# Patient Record
Sex: Male | Born: 1975 | Race: Asian | Hispanic: No | Marital: Married | Smoking: Current some day smoker
Health system: Southern US, Community
[De-identification: ages and names within clinical notes are randomized; demographics above are authoritative.]

## PROBLEM LIST (undated history)

## (undated) DIAGNOSIS — I10 Essential (primary) hypertension: Secondary | ICD-10-CM

## (undated) DIAGNOSIS — K265 Chronic or unspecified duodenal ulcer with perforation: Secondary | ICD-10-CM

---

## 2009-03-21 ENCOUNTER — Emergency Department (HOSPITAL_COMMUNITY): Admission: EM | Admit: 2009-03-21 | Discharge: 2009-03-21 | Payer: Self-pay | Admitting: Family Medicine

## 2009-03-22 ENCOUNTER — Emergency Department (HOSPITAL_COMMUNITY): Admission: EM | Admit: 2009-03-22 | Discharge: 2009-03-22 | Payer: Self-pay | Admitting: Emergency Medicine

## 2009-04-04 ENCOUNTER — Emergency Department (HOSPITAL_COMMUNITY): Admission: EM | Admit: 2009-04-04 | Discharge: 2009-04-04 | Payer: Self-pay | Admitting: Family Medicine

## 2009-04-06 ENCOUNTER — Emergency Department (HOSPITAL_COMMUNITY): Admission: EM | Admit: 2009-04-06 | Discharge: 2009-04-06 | Payer: Self-pay | Admitting: Family Medicine

## 2010-01-26 ENCOUNTER — Emergency Department (HOSPITAL_COMMUNITY): Admission: EM | Admit: 2010-01-26 | Discharge: 2010-01-26 | Payer: Self-pay | Admitting: Family Medicine

## 2010-01-28 ENCOUNTER — Inpatient Hospital Stay (HOSPITAL_COMMUNITY): Admission: EM | Admit: 2010-01-28 | Discharge: 2010-01-31 | Payer: Self-pay | Admitting: Emergency Medicine

## 2010-01-28 ENCOUNTER — Emergency Department (HOSPITAL_COMMUNITY): Admission: EM | Admit: 2010-01-28 | Discharge: 2010-01-28 | Payer: Self-pay | Admitting: Emergency Medicine

## 2010-01-29 ENCOUNTER — Ambulatory Visit: Payer: Self-pay | Admitting: Internal Medicine

## 2010-03-13 ENCOUNTER — Ambulatory Visit: Payer: Self-pay | Admitting: Internal Medicine

## 2010-03-20 ENCOUNTER — Encounter: Payer: Self-pay | Admitting: *Deleted

## 2010-12-15 NOTE — Miscellaneous (Signed)
Summary: Do Not Reschedule  Missed NP appt.  Per [facility] policy is not allowed to reschedule.  Dennison Nancy RN  Mar 20, 2010 5:32 PM

## 2011-02-08 LAB — COMPREHENSIVE METABOLIC PANEL
ALT: 54 U/L — ABNORMAL HIGH (ref 0–53)
Albumin: 3.5 g/dL (ref 3.5–5.2)
Alkaline Phosphatase: 73 U/L (ref 39–117)
BUN: 7 mg/dL (ref 6–23)
Calcium: 9.2 mg/dL (ref 8.4–10.5)
GFR calc non Af Amer: >60 mL/min (ref >60)
Glucose, Bld: 93 mg/dL (ref 70–99)
Sodium: 139 mEq/L (ref 135–145)
Total Bilirubin: 0.9 mg/dL (ref 0.3–1.2)
Total Protein: 6.9 g/dL (ref 6.0–8.3)

## 2011-02-08 LAB — CULTURE, ROUTINE-ABSCESS: Gram Stain: NONE SEEN

## 2011-02-08 LAB — CBC
HCT: 37.5 % — ABNORMAL LOW (ref 39.0–52.0)
Hemoglobin: 13 g/dL (ref 13.0–17.0)
Hemoglobin: 13.8 g/dL (ref 13.0–17.0)
MCHC: 34.2 g/dL (ref 30.0–36.0)
MCHC: 34.6 g/dL (ref 30.0–36.0)
MCHC: 34.8 g/dL (ref 30.0–36.0)
MCV: 92.6 fL (ref 78.0–100.0)
Platelets: 198 10*3/uL (ref 150–400)
Platelets: 199 10*3/uL (ref 150–400)
RBC: 4.59 MIL/uL (ref 4.22–5.81)
RDW: 12.3 % (ref 11.5–15.5)
RDW: 12.4 % (ref 11.5–15.5)
WBC: 13.7 10*3/uL — ABNORMAL HIGH (ref 4.0–10.5)
WBC: 6.7 10*3/uL (ref 4.0–10.5)
WBC: 9.6 10*3/uL (ref 4.0–10.5)

## 2011-02-08 LAB — CULTURE, BLOOD (ROUTINE X 2): Culture: NO GROWTH

## 2011-02-08 LAB — DIFFERENTIAL
Basophils Absolute: 0 10*3/uL (ref 0.0–0.1)
Eosinophils Relative: 2 % (ref 0–5)
Lymphocytes Relative: 11 % — ABNORMAL LOW (ref 12–46)
Lymphs Abs: 1.5 10*3/uL (ref 0.7–4.0)
Lymphs Abs: 1.5 10*3/uL (ref 0.7–4.0)
Monocytes Absolute: 0.7 10*3/uL (ref 0.1–1.0)
Monocytes Absolute: 0.8 10*3/uL (ref 0.1–1.0)
Monocytes Relative: 5 % (ref 3–12)
Monocytes Relative: 8 % (ref 3–12)
Neutro Abs: 7.1 10*3/uL (ref 1.7–7.7)
Neutrophils Relative %: 74 % (ref 43–77)
Neutrophils Relative %: 84 % — ABNORMAL HIGH (ref 43–77)

## 2011-02-08 LAB — HEPATITIS PANEL, ACUTE
HCV Ab: NEGATIVE
Hep A IgM: NEGATIVE
Hep B C IgM: NEGATIVE
Hepatitis B Surface Ag: NEGATIVE

## 2011-02-08 LAB — HEMOGLOBIN A1C: Mean Plasma Glucose: 108 mg/dL

## 2011-02-08 LAB — BASIC METABOLIC PANEL
GFR calc Af Amer: >60 mL/min (ref >60)
GFR calc Af Amer: >60 mL/min (ref >60)
GFR calc non Af Amer: >60 mL/min (ref >60)
Sodium: 137 mEq/L (ref 135–145)

## 2011-02-08 LAB — C4 COMPLEMENT: Complement C4, Body Fluid: 21 mg/dL (ref 16–47)

## 2011-02-23 LAB — CULTURE, ROUTINE-ABSCESS

## 2011-02-28 ENCOUNTER — Emergency Department (HOSPITAL_COMMUNITY)
Admission: EM | Admit: 2011-02-28 | Discharge: 2011-02-28 | Disposition: A | Discharge status: Home / Self Care | Payer: No Typology Code available for payment source | Attending: Emergency Medicine | Admitting: Emergency Medicine

## 2011-02-28 ENCOUNTER — Inpatient Hospital Stay (INDEPENDENT_AMBULATORY_CARE_PROVIDER_SITE_OTHER)
Admission: RE | Admit: 2011-02-28 | Discharge: 2011-02-28 | Disposition: A | Discharge status: Home / Self Care | Payer: No Typology Code available for payment source | Source: Ambulatory Visit | Attending: Family Medicine | Admitting: Family Medicine

## 2011-02-28 DIAGNOSIS — R109 Unspecified abdominal pain: Secondary | ICD-10-CM | POA: Insufficient documentation

## 2011-02-28 DIAGNOSIS — R112 Nausea with vomiting, unspecified: Secondary | ICD-10-CM

## 2011-02-28 DIAGNOSIS — R1011 Right upper quadrant pain: Secondary | ICD-10-CM

## 2011-02-28 DIAGNOSIS — I10 Essential (primary) hypertension: Secondary | ICD-10-CM

## 2011-02-28 DIAGNOSIS — R630 Anorexia: Secondary | ICD-10-CM | POA: Insufficient documentation

## 2011-02-28 LAB — POCT URINALYSIS DIP (DEVICE)
Bilirubin Urine: NEGATIVE
Glucose, UA: NEGATIVE mg/dL
Ketones, ur: NEGATIVE mg/dL
pH: 7.5 (ref 5.0–8.0)

## 2011-02-28 LAB — CBC
Hemoglobin: 16.1 g/dL (ref 13.0–17.0)
MCH: 32.2 pg (ref 26.0–34.0)
MCV: 88.2 fL (ref 78.0–100.0)
RBC: 5 MIL/uL (ref 4.22–5.81)
WBC: 12.6 10*3/uL — ABNORMAL HIGH (ref 4.0–10.5)

## 2011-02-28 LAB — DIFFERENTIAL
Lymphocytes Relative: 10 % — ABNORMAL LOW (ref 12–46)
Lymphs Abs: 1.2 10*3/uL (ref 0.7–4.0)
Monocytes Relative: 7 % (ref 3–12)
Neutro Abs: 10.4 10*3/uL — ABNORMAL HIGH (ref 1.7–7.7)
Neutrophils Relative %: 82 % — ABNORMAL HIGH (ref 43–77)

## 2011-02-28 LAB — COMPREHENSIVE METABOLIC PANEL
AST: 27 U/L (ref 0–37)
Alkaline Phosphatase: 60 U/L (ref 39–117)
BUN: 10 mg/dL (ref 6–23)
CO2: 31 mEq/L (ref 19–32)
Chloride: 99 mEq/L (ref 96–112)
Creatinine, Ser: 0.83 mg/dL (ref 0.4–1.5)
GFR calc non Af Amer: >60 mL/min (ref >60)
Potassium: 3.4 mEq/L — ABNORMAL LOW (ref 3.5–5.1)
Total Bilirubin: 0.8 mg/dL (ref 0.3–1.2)

## 2011-02-28 LAB — POCT I-STAT, CHEM 8
BUN: 13 mg/dL (ref 6–23)
Chloride: 98 mEq/L (ref 96–112)
Potassium: 3.4 mEq/L — ABNORMAL LOW (ref 3.5–5.1)
Sodium: 136 mEq/L (ref 135–145)

## 2011-03-01 ENCOUNTER — Emergency Department (HOSPITAL_COMMUNITY)
Admission: EM | Admit: 2011-03-01 | Discharge: 2011-03-02 | Disposition: A | Discharge status: Home / Self Care | Payer: No Typology Code available for payment source | Attending: Emergency Medicine | Admitting: Emergency Medicine

## 2011-03-01 DIAGNOSIS — R109 Unspecified abdominal pain: Secondary | ICD-10-CM | POA: Insufficient documentation

## 2011-03-01 LAB — CBC
HCT: 41.9 % (ref 39.0–52.0)
Hemoglobin: 15.4 g/dL (ref 13.0–17.0)
MCV: 86.9 fL (ref 78.0–100.0)
RBC: 4.82 MIL/uL (ref 4.22–5.81)
RDW: 12.2 % (ref 11.5–15.5)
WBC: 9.2 10*3/uL (ref 4.0–10.5)

## 2011-03-01 LAB — COMPREHENSIVE METABOLIC PANEL
ALT: 18 U/L (ref 0–53)
Alkaline Phosphatase: 53 U/L (ref 39–117)
BUN: 6 mg/dL (ref 6–23)
CO2: 26 mEq/L (ref 19–32)
Chloride: 101 mEq/L (ref 96–112)
Glucose, Bld: 121 mg/dL — ABNORMAL HIGH (ref 70–99)
Potassium: 3.5 mEq/L (ref 3.5–5.1)
Sodium: 133 mEq/L — ABNORMAL LOW (ref 135–145)
Total Bilirubin: 0.9 mg/dL (ref 0.3–1.2)
Total Protein: 7.3 g/dL (ref 6.0–8.3)

## 2011-03-01 LAB — URINE MICROSCOPIC-ADD ON

## 2011-03-01 LAB — URINALYSIS, ROUTINE W REFLEX MICROSCOPIC
Glucose, UA: NEGATIVE mg/dL
Hgb urine dipstick: NEGATIVE
Ketones, ur: 15 mg/dL — AB
Protein, ur: NEGATIVE mg/dL
Urobilinogen, UA: 1 mg/dL (ref 0.0–1.0)

## 2011-03-01 LAB — DIFFERENTIAL
Basophils Absolute: 0 10*3/uL (ref 0.0–0.1)
Eosinophils Relative: 0 % (ref 0–5)
Lymphocytes Relative: 16 % (ref 12–46)
Lymphs Abs: 1.5 10*3/uL (ref 0.7–4.0)
Neutro Abs: 6.8 10*3/uL (ref 1.7–7.7)
Neutrophils Relative %: 74 % (ref 43–77)

## 2011-03-01 LAB — LIPASE, BLOOD: Lipase: 19 U/L (ref 11–59)

## 2011-03-02 ENCOUNTER — Emergency Department (HOSPITAL_COMMUNITY): Payer: No Typology Code available for payment source

## 2014-08-15 ENCOUNTER — Encounter (HOSPITAL_COMMUNITY): Payer: Self-pay | Admitting: Emergency Medicine

## 2014-08-15 ENCOUNTER — Emergency Department (HOSPITAL_COMMUNITY): Payer: Medicaid Other

## 2014-08-15 ENCOUNTER — Encounter (HOSPITAL_COMMUNITY): Admission: EM | Disposition: A | Discharge status: Home / Self Care | Payer: Self-pay | Source: Home / Self Care

## 2014-08-15 ENCOUNTER — Inpatient Hospital Stay (HOSPITAL_COMMUNITY)
Admission: EM | Admit: 2014-08-15 | Discharge: 2014-08-20 | DRG: 327 | Disposition: A | Discharge status: Home / Self Care | Payer: Medicaid Other | Attending: General Surgery | Admitting: General Surgery

## 2014-08-15 ENCOUNTER — Encounter (HOSPITAL_COMMUNITY): Payer: Medicaid Other | Admitting: Anesthesiology

## 2014-08-15 ENCOUNTER — Emergency Department (HOSPITAL_COMMUNITY): Payer: Medicaid Other | Admitting: Anesthesiology

## 2014-08-15 DIAGNOSIS — K913 Postprocedural intestinal obstruction: Secondary | ICD-10-CM | POA: Diagnosis present

## 2014-08-15 DIAGNOSIS — Y839 Surgical procedure, unspecified as the cause of abnormal reaction of the patient, or of later complication, without mention of misadventure at the time of the procedure: Secondary | ICD-10-CM | POA: Diagnosis present

## 2014-08-15 DIAGNOSIS — R1013 Epigastric pain: Secondary | ICD-10-CM | POA: Diagnosis present

## 2014-08-15 DIAGNOSIS — R198 Other specified symptoms and signs involving the digestive system and abdomen: Secondary | ICD-10-CM | POA: Diagnosis present

## 2014-08-15 DIAGNOSIS — F17210 Nicotine dependence, cigarettes, uncomplicated: Secondary | ICD-10-CM | POA: Diagnosis present

## 2014-08-15 DIAGNOSIS — K265 Chronic or unspecified duodenal ulcer with perforation: Principal | ICD-10-CM

## 2014-08-15 HISTORY — DX: Chronic or unspecified duodenal ulcer with perforation: K26.5

## 2014-08-15 HISTORY — PX: REPAIR OF PERFORATED ULCER: SHX6065

## 2014-08-15 HISTORY — PX: LAPAROTOMY: SHX154

## 2014-08-15 LAB — URINALYSIS, ROUTINE W REFLEX MICROSCOPIC
BILIRUBIN URINE: NEGATIVE
GLUCOSE, UA: NEGATIVE mg/dL
HGB URINE DIPSTICK: NEGATIVE
KETONES UR: NEGATIVE mg/dL
Leukocytes, UA: NEGATIVE
Nitrite: NEGATIVE
PROTEIN: NEGATIVE mg/dL
Specific Gravity, Urine: 1.014 (ref 1.005–1.030)
UROBILINOGEN UA: 0.2 mg/dL (ref 0.0–1.0)
pH: 7 (ref 5.0–8.0)

## 2014-08-15 LAB — COMPREHENSIVE METABOLIC PANEL
ALK PHOS: 46 U/L (ref 39–117)
ALT: 18 U/L (ref 0–53)
ANION GAP: 13 (ref 5–15)
AST: 22 U/L (ref 0–37)
Albumin: 3.8 g/dL (ref 3.5–5.2)
BILIRUBIN TOTAL: 0.3 mg/dL (ref 0.3–1.2)
BUN: 15 mg/dL (ref 6–23)
CO2: 26 meq/L (ref 19–32)
Calcium: 8.9 mg/dL (ref 8.4–10.5)
Chloride: 99 mEq/L (ref 96–112)
Creatinine, Ser: 0.61 mg/dL (ref 0.50–1.35)
GLUCOSE: 119 mg/dL — AB (ref 70–99)
POTASSIUM: 3.7 meq/L (ref 3.7–5.3)
Sodium: 138 mEq/L (ref 137–147)
TOTAL PROTEIN: 7.3 g/dL (ref 6.0–8.3)

## 2014-08-15 LAB — CBC WITH DIFFERENTIAL/PLATELET
Basophils Absolute: 0 10*3/uL (ref 0.0–0.1)
Basophils Relative: 0 % (ref 0–1)
EOS ABS: 0 10*3/uL (ref 0.0–0.7)
Eosinophils Relative: 1 % (ref 0–5)
HCT: 42.7 % (ref 39.0–52.0)
HEMOGLOBIN: 14.9 g/dL (ref 13.0–17.0)
LYMPHS ABS: 1.6 10*3/uL (ref 0.7–4.0)
LYMPHS PCT: 22 % (ref 12–46)
MCH: 31.8 pg (ref 26.0–34.0)
MCHC: 34.9 g/dL (ref 30.0–36.0)
MCV: 91.2 fL (ref 78.0–100.0)
MONOS PCT: 5 % (ref 3–12)
Monocytes Absolute: 0.4 10*3/uL (ref 0.1–1.0)
NEUTROS PCT: 72 % (ref 43–77)
Neutro Abs: 5.5 10*3/uL (ref 1.7–7.7)
Platelets: 156 10*3/uL (ref 150–400)
RBC: 4.68 MIL/uL (ref 4.22–5.81)
RDW: 12.1 % (ref 11.5–15.5)
WBC: 7.6 10*3/uL (ref 4.0–10.5)

## 2014-08-15 LAB — APTT: aPTT: 23 seconds — ABNORMAL LOW (ref 24–37)

## 2014-08-15 LAB — TYPE AND SCREEN
ABO/RH(D): A POS
ANTIBODY SCREEN: NEGATIVE

## 2014-08-15 LAB — ABO/RH: ABO/RH(D): A POS

## 2014-08-15 LAB — PROTIME-INR
INR: 0.99 (ref 0.00–1.49)
Prothrombin Time: 13.1 seconds (ref 11.6–15.2)

## 2014-08-15 LAB — LACTIC ACID, PLASMA: LACTIC ACID, VENOUS: 1 mmol/L (ref 0.5–2.2)

## 2014-08-15 LAB — LIPASE, BLOOD: Lipase: 31 U/L (ref 11–59)

## 2014-08-15 SURGERY — LAPAROTOMY, EXPLORATORY
Anesthesia: General | Site: Abdomen

## 2014-08-15 MED ORDER — ONDANSETRON HCL 4 MG/2ML IJ SOLN
INTRAMUSCULAR | Status: DC | PRN
  Administered 2014-08-15: 4 mg via INTRAVENOUS

## 2014-08-15 MED ORDER — ROCURONIUM BROMIDE 100 MG/10ML IV SOLN
INTRAVENOUS | Status: DC | PRN
  Administered 2014-08-15: 30 mg via INTRAVENOUS

## 2014-08-15 MED ORDER — ONDANSETRON HCL 4 MG/2ML IJ SOLN
4.0000 mg | Freq: Once | INTRAMUSCULAR | Status: AC
  Administered 2014-08-15: 4 mg via INTRAVENOUS
  Filled 2014-08-15: qty 2

## 2014-08-15 MED ORDER — MIDAZOLAM HCL 2 MG/2ML IJ SOLN
INTRAMUSCULAR | Status: AC
  Filled 2014-08-15: qty 2

## 2014-08-15 MED ORDER — IOHEXOL 300 MG/ML  SOLN
80.0000 mL | Freq: Once | INTRAMUSCULAR | Status: AC | PRN
  Administered 2014-08-15: 80 mL via INTRAVENOUS

## 2014-08-15 MED ORDER — NALOXONE HCL 0.4 MG/ML IJ SOLN: 0.4000 mg | INTRAMUSCULAR | Status: DC | PRN

## 2014-08-15 MED ORDER — OXYCODONE HCL 5 MG/5ML PO SOLN: 5.0000 mg | Freq: Once | ORAL | Status: DC | PRN

## 2014-08-15 MED ORDER — PROPOFOL 10 MG/ML IV BOLUS
INTRAVENOUS | Status: AC
  Filled 2014-08-15: qty 20

## 2014-08-15 MED ORDER — PROPOFOL 10 MG/ML IV BOLUS
INTRAVENOUS | Status: DC | PRN
  Administered 2014-08-15: 180 mg via INTRAVENOUS

## 2014-08-15 MED ORDER — LACTATED RINGERS IV SOLN
INTRAVENOUS | Status: DC | PRN
  Administered 2014-08-15 (×2): via INTRAVENOUS

## 2014-08-15 MED ORDER — DIPHENHYDRAMINE HCL 50 MG/ML IJ SOLN: 12.5000 mg | Freq: Four times a day (QID) | INTRAMUSCULAR | Status: DC | PRN

## 2014-08-15 MED ORDER — MORPHINE SULFATE (PF) 1 MG/ML IV SOLN
INTRAVENOUS | Status: AC
  Filled 2014-08-15: qty 25

## 2014-08-15 MED ORDER — ACETAMINOPHEN 650 MG RE SUPP: 650.0000 mg | RECTAL | Status: DC | PRN

## 2014-08-15 MED ORDER — MORPHINE SULFATE (PF) 1 MG/ML IV SOLN
INTRAVENOUS | Status: DC
  Administered 2014-08-15 (×2): 1.5 mg via INTRAVENOUS
  Administered 2014-08-16: 4.5 mg via INTRAVENOUS
  Administered 2014-08-16: 6 mg via INTRAVENOUS
  Administered 2014-08-16: 3 mg via INTRAVENOUS
  Administered 2014-08-16: 10.5 mg via INTRAVENOUS
  Administered 2014-08-16: 6 mg via INTRAVENOUS
  Administered 2014-08-16: 14:00:00 via INTRAVENOUS
  Administered 2014-08-16: 1.5 mg via INTRAVENOUS
  Administered 2014-08-17: 4.5 mg via INTRAVENOUS
  Administered 2014-08-17: 3 mg via INTRAVENOUS
  Administered 2014-08-17: 1.5 mg via INTRAVENOUS
  Administered 2014-08-17: 12:00:00 via INTRAVENOUS
  Administered 2014-08-17: 1.5 mg via INTRAVENOUS
  Administered 2014-08-17: 0.3 mg via INTRAVENOUS
  Administered 2014-08-17: 4.5 mg via INTRAVENOUS
  Administered 2014-08-18: 1.5 mg via INTRAVENOUS
  Administered 2014-08-18 (×2): 1 mL via INTRAVENOUS
  Administered 2014-08-18: 6 mg via INTRAVENOUS
  Administered 2014-08-19: 1.5 mg via INTRAVENOUS
  Filled 2014-08-15 (×2): qty 25

## 2014-08-15 MED ORDER — HYDROMORPHONE HCL 1 MG/ML IJ SOLN
1.0000 mg | Freq: Once | INTRAMUSCULAR | Status: AC
  Administered 2014-08-15: 1 mg via INTRAVENOUS
  Filled 2014-08-15: qty 1

## 2014-08-15 MED ORDER — KCL IN DEXTROSE-NACL 20-5-0.45 MEQ/L-%-% IV SOLN
INTRAVENOUS | Status: DC
  Administered 2014-08-16 – 2014-08-19 (×8): via INTRAVENOUS
  Filled 2014-08-15 (×13): qty 1000

## 2014-08-15 MED ORDER — 0.9 % SODIUM CHLORIDE (POUR BTL) OPTIME
TOPICAL | Status: DC | PRN
  Administered 2014-08-15 (×2): 1000 mL

## 2014-08-15 MED ORDER — LIDOCAINE HCL (CARDIAC) 20 MG/ML IV SOLN
INTRAVENOUS | Status: DC | PRN
  Administered 2014-08-15: 60 mg via INTRAVENOUS

## 2014-08-15 MED ORDER — DIPHENHYDRAMINE HCL 12.5 MG/5ML PO ELIX: 12.5000 mg | ORAL_SOLUTION | Freq: Four times a day (QID) | ORAL | Status: DC | PRN

## 2014-08-15 MED ORDER — PANTOPRAZOLE SODIUM 40 MG IV SOLR
40.0000 mg | Freq: Two times a day (BID) | INTRAVENOUS | Status: DC
  Administered 2014-08-15 – 2014-08-20 (×10): 40 mg via INTRAVENOUS
  Filled 2014-08-15 (×13): qty 40

## 2014-08-15 MED ORDER — FLUCONAZOLE IN SODIUM CHLORIDE 200-0.9 MG/100ML-% IV SOLN
200.0000 mg | INTRAVENOUS | Status: DC
  Administered 2014-08-16 – 2014-08-19 (×4): 200 mg via INTRAVENOUS
  Filled 2014-08-15 (×6): qty 100

## 2014-08-15 MED ORDER — HYDROMORPHONE HCL 1 MG/ML IJ SOLN
2.0000 mg | Freq: Once | INTRAMUSCULAR | Status: AC
  Administered 2014-08-15: 2 mg via INTRAVENOUS
  Filled 2014-08-15: qty 2

## 2014-08-15 MED ORDER — OXYCODONE HCL 5 MG PO TABS: 5.0000 mg | ORAL_TABLET | Freq: Once | ORAL | Status: DC | PRN

## 2014-08-15 MED ORDER — FLUCONAZOLE IN SODIUM CHLORIDE 200-0.9 MG/100ML-% IV SOLN
200.0000 mg | Freq: Once | INTRAVENOUS | Status: AC
  Administered 2014-08-15: 200 mg via INTRAVENOUS
  Filled 2014-08-15 (×2): qty 100

## 2014-08-15 MED ORDER — LABETALOL HCL 5 MG/ML IV SOLN
INTRAVENOUS | Status: DC | PRN
  Administered 2014-08-15 (×2): 5 mg via INTRAVENOUS

## 2014-08-15 MED ORDER — HYDROMORPHONE HCL 1 MG/ML IJ SOLN: 0.2500 mg | INTRAMUSCULAR | Status: DC | PRN

## 2014-08-15 MED ORDER — NEOSTIGMINE METHYLSULFATE 10 MG/10ML IV SOLN
INTRAVENOUS | Status: AC
  Filled 2014-08-15: qty 1

## 2014-08-15 MED ORDER — SODIUM CHLORIDE 0.9 % IJ SOLN: 9.0000 mL | INTRAMUSCULAR | Status: DC | PRN

## 2014-08-15 MED ORDER — GLYCOPYRROLATE 0.2 MG/ML IJ SOLN
INTRAMUSCULAR | Status: AC
  Filled 2014-08-15: qty 2

## 2014-08-15 MED ORDER — GLYCOPYRROLATE 0.2 MG/ML IJ SOLN
INTRAMUSCULAR | Status: DC | PRN
  Administered 2014-08-15: 0.4 mg via INTRAVENOUS

## 2014-08-15 MED ORDER — PIPERACILLIN-TAZOBACTAM 3.375 G IVPB
3.3750 g | Freq: Three times a day (TID) | INTRAVENOUS | Status: DC
  Administered 2014-08-15 – 2014-08-20 (×14): 3.375 g via INTRAVENOUS
  Filled 2014-08-15 (×19): qty 50

## 2014-08-15 MED ORDER — KETOROLAC TROMETHAMINE 30 MG/ML IJ SOLN: 15.0000 mg | Freq: Once | INTRAMUSCULAR | Status: DC | PRN

## 2014-08-15 MED ORDER — PIPERACILLIN-TAZOBACTAM 3.375 G IVPB
3.3750 g | Freq: Three times a day (TID) | INTRAVENOUS | Status: DC
  Administered 2014-08-15: 3.375 g via INTRAVENOUS
  Filled 2014-08-15 (×3): qty 50

## 2014-08-15 MED ORDER — SODIUM CHLORIDE 0.9 % IV BOLUS (SEPSIS)
1000.0000 mL | Freq: Once | INTRAVENOUS | Status: AC
  Administered 2014-08-15: 1000 mL via INTRAVENOUS

## 2014-08-15 MED ORDER — ACETAMINOPHEN 160 MG/5ML PO SOLN: 325.0000 mg | ORAL | Status: DC | PRN

## 2014-08-15 MED ORDER — FENTANYL CITRATE 0.05 MG/ML IJ SOLN
INTRAMUSCULAR | Status: AC
  Filled 2014-08-15: qty 5

## 2014-08-15 MED ORDER — NEOSTIGMINE METHYLSULFATE 10 MG/10ML IV SOLN
INTRAVENOUS | Status: DC | PRN
  Administered 2014-08-15: 3 mg via INTRAVENOUS

## 2014-08-15 MED ORDER — SUCCINYLCHOLINE CHLORIDE 20 MG/ML IJ SOLN
INTRAMUSCULAR | Status: DC | PRN
  Administered 2014-08-15: 40 mg via INTRAVENOUS

## 2014-08-15 MED ORDER — ACETAMINOPHEN 325 MG PO TABS: 325.0000 mg | ORAL_TABLET | ORAL | Status: DC | PRN

## 2014-08-15 MED ORDER — FENTANYL CITRATE 0.05 MG/ML IJ SOLN
INTRAMUSCULAR | Status: DC | PRN
  Administered 2014-08-15: 50 ug via INTRAVENOUS
  Administered 2014-08-15: 100 ug via INTRAVENOUS
  Administered 2014-08-15 (×2): 50 ug via INTRAVENOUS

## 2014-08-15 MED ORDER — ONDANSETRON HCL 4 MG/2ML IJ SOLN: 4.0000 mg | Freq: Four times a day (QID) | INTRAMUSCULAR | Status: DC | PRN

## 2014-08-15 MED ORDER — LACTATED RINGERS IV SOLN
INTRAVENOUS | Status: DC
  Administered 2014-08-15: 15:00:00 via INTRAVENOUS

## 2014-08-15 MED ORDER — IOHEXOL 300 MG/ML  SOLN
25.0000 mL | Freq: Once | INTRAMUSCULAR | Status: AC | PRN
  Administered 2014-08-15: 25 mL via ORAL

## 2014-08-15 MED ORDER — ENOXAPARIN SODIUM 40 MG/0.4ML ~~LOC~~ SOLN
40.0000 mg | SUBCUTANEOUS | Status: DC
  Administered 2014-08-16 – 2014-08-19 (×4): 40 mg via SUBCUTANEOUS
  Filled 2014-08-15 (×6): qty 0.4

## 2014-08-15 MED ORDER — MIDAZOLAM HCL 5 MG/5ML IJ SOLN
INTRAMUSCULAR | Status: DC | PRN
  Administered 2014-08-15: 2 mg via INTRAVENOUS

## 2014-08-15 SURGICAL SUPPLY — 50 items
BLADE SURG ROTATE 9660 (MISCELLANEOUS) IMPLANT
CANISTER SUCTION 2500CC (MISCELLANEOUS) ×3 IMPLANT
CHLORAPREP W/TINT 26ML (MISCELLANEOUS) ×3 IMPLANT
COVER MAYO STAND STRL (DRAPES) IMPLANT
COVER SURGICAL LIGHT HANDLE (MISCELLANEOUS) ×3 IMPLANT
DRAPE LAPAROSCOPIC ABDOMINAL (DRAPES) ×3 IMPLANT
DRAPE PROXIMA HALF (DRAPES) IMPLANT
DRAPE UTILITY 15X26 W/TAPE STR (DRAPE) ×6 IMPLANT
DRAPE WARM FLUID 44X44 (DRAPE) ×3 IMPLANT
DRSG OPSITE POSTOP 4X10 (GAUZE/BANDAGES/DRESSINGS) IMPLANT
DRSG OPSITE POSTOP 4X8 (GAUZE/BANDAGES/DRESSINGS) IMPLANT
ELECT BLADE 6.5 EXT (BLADE) IMPLANT
ELECT CAUTERY BLADE 6.4 (BLADE) ×3 IMPLANT
ELECT REM PT RETURN 9FT ADLT (ELECTROSURGICAL) ×3
ELECTRODE REM PT RTRN 9FT ADLT (ELECTROSURGICAL) ×1 IMPLANT
GLOVE BIO SURGEON STRL SZ8 (GLOVE) ×3 IMPLANT
GLOVE BIOGEL PI IND STRL 7.0 (GLOVE) ×1 IMPLANT
GLOVE BIOGEL PI IND STRL 8 (GLOVE) ×1 IMPLANT
GLOVE BIOGEL PI INDICATOR 7.0 (GLOVE) ×2
GLOVE BIOGEL PI INDICATOR 8 (GLOVE) ×2
GLOVE SURG SS PI 7.0 STRL IVOR (GLOVE) ×3 IMPLANT
GOWN STRL REUS W/ TWL LRG LVL3 (GOWN DISPOSABLE) ×2 IMPLANT
GOWN STRL REUS W/ TWL XL LVL3 (GOWN DISPOSABLE) ×1 IMPLANT
GOWN STRL REUS W/TWL LRG LVL3 (GOWN DISPOSABLE) ×4
GOWN STRL REUS W/TWL XL LVL3 (GOWN DISPOSABLE) ×2
KIT BASIN OR (CUSTOM PROCEDURE TRAY) ×3 IMPLANT
KIT ROOM TURNOVER OR (KITS) ×3 IMPLANT
LIGASURE IMPACT 36 18CM CVD LR (INSTRUMENTS) IMPLANT
NS IRRIG 1000ML POUR BTL (IV SOLUTION) ×6 IMPLANT
PACK GENERAL/GYN (CUSTOM PROCEDURE TRAY) ×3 IMPLANT
PAD ARMBOARD 7.5X6 YLW CONV (MISCELLANEOUS) ×3 IMPLANT
PENCIL BUTTON HOLSTER BLD 10FT (ELECTRODE) IMPLANT
SPECIMEN JAR LARGE (MISCELLANEOUS) IMPLANT
SPONGE GAUZE 4X4 12PLY STER LF (GAUZE/BANDAGES/DRESSINGS) ×3 IMPLANT
SPONGE LAP 18X18 X RAY DECT (DISPOSABLE) IMPLANT
STAPLER VISISTAT 35W (STAPLE) ×3 IMPLANT
SUCTION POOLE TIP (SUCTIONS) ×3 IMPLANT
SUT PDS AB 1 TP1 96 (SUTURE) ×6 IMPLANT
SUT SILK 2 0 SH CR/8 (SUTURE) ×3 IMPLANT
SUT SILK 2 0 TIES 10X30 (SUTURE) ×3 IMPLANT
SUT SILK 3 0 SH CR/8 (SUTURE) ×3 IMPLANT
SUT SILK 3 0 TIES 10X30 (SUTURE) ×3 IMPLANT
SWAB CULTURE LIQUID MINI MALE (MISCELLANEOUS) ×3 IMPLANT
TAPE CLOTH SURG 4X10 WHT LF (GAUZE/BANDAGES/DRESSINGS) ×3 IMPLANT
TOWEL OR 17X26 10 PK STRL BLUE (TOWEL DISPOSABLE) ×3 IMPLANT
TRAY FOLEY CATH 16FRSI W/METER (SET/KITS/TRAYS/PACK) IMPLANT
TUBE ANAEROBIC SPECIMEN COL (MISCELLANEOUS) ×3 IMPLANT
TUBE CONNECTING 12'X1/4 (SUCTIONS)
TUBE CONNECTING 12X1/4 (SUCTIONS) IMPLANT
YANKAUER SUCT BULB TIP NO VENT (SUCTIONS) IMPLANT

## 2014-08-15 NOTE — ED Notes (Signed)
Surgery PA at bedside.  

## 2014-08-15 NOTE — H&P (Signed)
Patient examined and I agree with the assessment and plan For repair perforated duodenal ulcer. Procedure. Risks, and benefis D/W him with the assistance of friend/interpreter. He is agreeable. Zosyn and Diflucan IV. Violeta GelinasBurke Ireland Virrueta, MD, MPH, FACS Trauma: [phone] General Surgery: [phone]  08/15/2014 2:53 PM

## 2014-08-15 NOTE — Anesthesia Postprocedure Evaluation (Signed)
  Anesthesia Post-op Note  Patient: [name] [name]  Procedure(s) Performed: Procedure(s): EXPLORATORY LAPAROTOMY (N/A) REPAIR OF PERFORATED DUODENAL  ULCER (N/A)  Patient Location: PACU  Anesthesia Type: General   Level of Consciousness: awake, alert  and oriented  Airway and Oxygen Therapy: Patient Spontanous Breathing  Post-op Pain: mild  Post-op Assessment: Post-op Vital signs reviewed  Post-op Vital Signs: Reviewed  Last Vitals:  Filed Vitals:   08/15/14 1644  BP: 139/92  Pulse: 78  Temp:   Resp: 13    Complications: No apparent anesthesia complications

## 2014-08-15 NOTE — Op Note (Signed)
08/15/2014  4:16 PM  PATIENT:  [name] [name]  38 y.o. male  PRE-OPERATIVE DIAGNOSIS:  Perforated duodenal ulcer  POST-OPERATIVE DIAGNOSIS:  Perforated Duodenal ulcer  PROCEDURE:  Procedure(s): EXPLORATORY LAPAROTOMY REPAIR OF PERFORATED DUODENAL  ULCER  SURGEON:  Surgeon(s): Violeta GelinasBurke Datron Brakebill, MD  ASSISTANTS: none   ANESTHESIA:   general  EBL:  Total I/O In: 1000 [I.V.:1000] Out: 300 [Urine:250; Blood:50]  BLOOD ADMINISTERED:none  DRAINS: none   SPECIMEN:  No Specimen  DISPOSITION OF SPECIMEN:  N/A  COUNTS:  YES  DICTATION: .Dragon Dictation Patient is brought emergently for repair of perforated duodenal ulcer. Informed consent was obtained with the assistance of a translator. He received intravenous antibiotics. He was brought to the operating room and general endotracheal anesthesia was administered by the anesthesia staff. Foley catheter was placed by nursing. His abdomen was prepped and draped in sterile fashion. We did time out procedure. Upper midline incision was made. Subcutaneous tissues were dissected down revealing the anterior fascia which was divided along the midline. The Peritoneal cavity was  Entered Under direct vision. The fascia was opened to the length of the incision. There was mild contamination of gastric contents, most consistent with CT contrast. This was evacuated. Exploration revealed a small perforation, 6 mm anteriorly on the first portion of the duodenum. Tissue was viable. Nasogastric tube was repositioned & the stomach was evacuated. The perforated ulcer was then closed with multiple interrupted 2-0 silk sutures. The tails were left long and a piece of omentum was fashioned to lay across the repair. The sutures were tied securing the omentum in place. Stomach was manipulated and no leakage was seen. Abdomen was then copiously irrigated with multiple liters of warm saline until clear. Repair was rechecked and appeared intact and viable. Remainder of the bowel  was returned to anatomic position. Hemostasis was ensured. Fascia was closed with running #1 looped PDS from each end and tied in the middle. Skin was left open with wet-to-dry dressings.All counts were correct. Patient tolerated the procedure well without apparent complication and was taken recovery in stable condition.  PATIENT DISPOSITION:  PACU - hemodynamically stable.   Delay start of Pharmacological VTE agent (>24hrs) due to surgical blood loss or risk of bleeding:  no  Violeta GelinasBurke Zayn Selley, MD, MPH, FACS Pager: [phone]  10/1/20154:16 PM

## 2014-08-15 NOTE — H&P (Signed)
[name] [name] 04/30/1976  [id number].   Primary Care MD: none Chief Complaint/Reason for Consult: perforated viscus HPI: This is an otherwise healthy 38 yo [place] male who has been in the [place] for 6 years.  His friend translates for him.  He states he has had some upper abdominal pain for over a year.  He did see someone for this and thinks he was started on maybe some reflux medication.  He no longer takes this apparently.  He started having worsening abdominal pain 3 days ago.  It has been waxing and waning, until this morning when his pain significantly worsened.  He did eat some rice this morning.  He denies nausea or vomiting.  He had a normal BM yesterday.  He denies fevers or chills. He denies taking NSAIDs or ASA.  He presented to [facility] this morning for further evaluation.  He had a CT scan that revealed perforated duodenal ulcer with extravasation of contrast and pneumoperitoneum.  We have been called for further evaluation.  ROS: Please see HPI, he admits to some SOB secondary to abdominal pain.  Otherwise all other systems are negative.  History reviewed. No pertinent family history.  History reviewed. No pertinent past medical history.  History reviewed. No pertinent past surgical history.  Social History:  reports that he has been smoking.  He does not have any smokeless tobacco history on file. He reports that he drinks about 3 ounces of alcohol per week. He reports that he does not use illicit drugs.  Allergies: No Known Allergies   (Not in a hospital admission)  Blood pressure 149/87, temperature 97.8 F (36.6 C), temperature source Oral, resp. rate 20, SpO2 99.00%. Physical Exam: General: pleasant, WD, WN Asian male who is laying in bed in mild distress secondary to pain HEENT: head is normocephalic, atraumatic.  Sclera are noninjected.  PERRL.  Ears and nose without any masses or lesions.  Mouth is pink and moist Heart: regular, rate, and rhythm.  Normal s1,s2. No obvious  murmurs, gallops, or rubs noted.  Palpable radial and pedal pulses bilaterally Lungs: CTAB, no wheezes, rhonchi, or rales noted.  Respiratory effort nonlabored, but tachypnea present secondary to pain Abd: "washboard" abdomen, absent bowel sounds, + peritoneal signs, no masses, hernias, or organomegaly MS: all 4 extremities are symmetrical with no cyanosis, clubbing, or edema. Skin: warm and dry with no masses, lesions, or rashes Psych: A&Ox3 with an appropriate affect.    Results for orders placed during the hospital encounter of 08/15/14 (from the past 48 hour(s))  CBC WITH DIFFERENTIAL     Status: None   Collection Time    08/15/14 11:15 AM      Result Value Ref Range   WBC 7.6  4.0 - 10.5 K/uL   RBC 4.68  4.22 - 5.81 MIL/uL   Hemoglobin 14.9  13.0 - 17.0 g/dL   HCT 42.7  39.0 - 52.0 %   MCV 91.2  78.0 - 100.0 fL   MCH 31.8  26.0 - 34.0 pg   MCHC 34.9  30.0 - 36.0 g/dL   RDW 12.1  11.5 - 15.5 %   Platelets 156  150 - 400 K/uL   Neutrophils Relative % 72  43 - 77 %   Neutro Abs 5.5  1.7 - 7.7 K/uL   Lymphocytes Relative 22  12 - 46 %   Lymphs Abs 1.6  0.7 - 4.0 K/uL   Monocytes Relative 5  3 - 12 %   Monocytes Absolute 0.4  0.1 - 1.0 K/uL   Eosinophils Relative 1  0 - 5 %   Eosinophils Absolute 0.0  0.0 - 0.7 K/uL   Basophils Relative 0  0 - 1 %   Basophils Absolute 0.0  0.0 - 0.1 K/uL  COMPREHENSIVE METABOLIC PANEL     Status: Abnormal   Collection Time    08/15/14 11:15 AM      Result Value Ref Range   Sodium 138  137 - 147 mEq/L   Potassium 3.7  3.7 - 5.3 mEq/L   Chloride 99  96 - 112 mEq/L   CO2 26  19 - 32 mEq/L   Glucose, Bld 119 (*) 70 - 99 mg/dL   BUN 15  6 - 23 mg/dL   Creatinine, Ser 0.61  0.50 - 1.35 mg/dL   Calcium 8.9  8.4 - 10.5 mg/dL   Total Protein 7.3  6.0 - 8.3 g/dL   Albumin 3.8  3.5 - 5.2 g/dL   AST 22  0 - 37 U/L   ALT 18  0 - 53 U/L   Alkaline Phosphatase 46  39 - 117 U/L   Total Bilirubin 0.3  0.3 - 1.2 mg/dL   GFR calc non Af Amer >90  >90  mL/min   GFR calc Af Amer >90  >90 mL/min   Comment: (NOTE)     The eGFR has been calculated using the CKD EPI equation.     This calculation has not been validated in all clinical situations.     eGFR's persistently <90 mL/min signify possible Chronic Kidney     Disease.   Anion gap 13  5 - 15  LIPASE, BLOOD     Status: None   Collection Time    08/15/14 11:15 AM      Result Value Ref Range   Lipase 31  11 - 59 U/L  URINALYSIS, ROUTINE W REFLEX MICROSCOPIC     Status: None   Collection Time    08/15/14 12:26 PM      Result Value Ref Range   Color, Urine YELLOW  YELLOW   APPearance CLEAR  CLEAR   Specific Gravity, Urine 1.014  1.005 - 1.030   pH 7.0  5.0 - 8.0   Glucose, UA NEGATIVE  NEGATIVE mg/dL   Hgb urine dipstick NEGATIVE  NEGATIVE   Bilirubin Urine NEGATIVE  NEGATIVE   Ketones, ur NEGATIVE  NEGATIVE mg/dL   Protein, ur NEGATIVE  NEGATIVE mg/dL   Urobilinogen, UA 0.2  0.0 - 1.0 mg/dL   Nitrite NEGATIVE  NEGATIVE   Leukocytes, UA NEGATIVE  NEGATIVE   Comment: MICROSCOPIC NOT DONE ON URINES WITH NEGATIVE PROTEIN, BLOOD, LEUKOCYTES, NITRITE, OR GLUCOSE <1000 mg/dL.  APTT     Status: Abnormal   Collection Time    08/15/14  1:31 PM      Result Value Ref Range   aPTT 23 (*) 24 - 37 seconds  PROTIME-INR     Status: None   Collection Time    08/15/14  1:31 PM      Result Value Ref Range   Prothrombin Time 13.1  11.6 - 15.2 seconds   INR 0.99  0.00 - 1.49   Ct Abdomen Pelvis W Contrast  08/15/2014   CLINICAL DATA:  Three-day history of intermittent right upper quadrant pain  EXAM: CT ABDOMEN AND PELVIS WITH CONTRAST  TECHNIQUE: Multidetector CT imaging of the abdomen and pelvis was performed using the standard protocol following bolus administration of intravenous contrast. Oral contrast  was also administered.  CONTRAST:  30m OMNIPAQUE IOHEXOL 300 MG/ML  SOLN  COMPARISON:  Ultrasound abdomen March 02, 2011  FINDINGS: There is pneumoperitoneum. There is oral contrast tracking  from the proximal duodenum into the fissure for the ligamentum teres consistent with perforated duodenal ulcer. There is pneumoperitoneum tracking along with this sella oral contrast from the duodenum. There is marked thickening of the wall of the proximal duodenum. Oral contrast is seen to pool along the wall of the anterior right abdomen.  Lung bases are clear.  No focal liver lesions are identified. There is no gallbladder wall thickening. There is no biliary duct dilatation.  Spleen, pancreas, and adrenals appear normal. Kidneys bilaterally show no mass or hydronephrosis on either side. There is no renal or ureteral calculus on either side.  In the pelvis, the urinary bladder is midline with normal wall thickness. There is ascites in the pelvis. Ascites is also seen tracking along the inferior liver. There is no pelvic mass. Appendix appears normal.  There is no evidence of bowel obstruction. There is no portal venous air.  There is no adenopathy or abscess in the abdomen or pelvis. Aorta appears unremarkable. 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 There are no blastic or lytic bone lesions.  IMPRESSION: Evidence of perforated duodenal ulcer with pneumoperitoneum an as well as oral contrast tracking from the duodenum through the fissure for the ligamentum teres to pool along the anterior aspect of the right abdomen. There is also free air tracking along this region. There is marked thickening of the wall of the duodenum in this area of apparent ulcer perforation.  Study otherwise essentially unremarkable.  Critical Value/emergent results were called by telephone at the time of interpretation on 08/15/2014 at 1:20 pm to Dr. JGustavus Bryant, who verbally acknowledged these results.   Electronically Signed   By: WLowella GripM.D.   On: 08/15/2014 13:22       Assessment/Plan 1. Pneumoperitoneum secondary to perforated viscus, likely duodenal ulcer  Plan: 1. The  patient will be admitted and emergently taken to the operating room for exploration.  He will be started on Zosyn and Diflucan for appropriate coverage.  He will remain NPO for OR.  The procedure including risks and complications were explained to the patient via his friend who is translating.  He understands and is agreeable to proceed with OR.  He has been informed his expected length of stay will be about a week.  He understands this as well.  Vinh Sachs E 08/15/2014, 1:58 PM Pager: [phone]

## 2014-08-15 NOTE — ED Provider Notes (Signed)
Medical screening examination/treatment/procedure(s) were conducted as a shared visit with resident physician and myself.  I personally evaluated the patient during the encounter.   I interviewed and examined the patient. Lungs are CTAB. Cardiac exam wnl. Abdomen w diffuse ttp, rigidity and guarding noted. CT c/w perforated duodenal ulcer and pneumoperitoneum. General surgery emergently consulted who will take the pt to the OR.   CRITICAL CARE Performed by: Purvis SheffieldHARRISON, Aaleeyah Bias, S Total critical care time: 30 min Critical care time was exclusive of separately billable procedures and treating other patients. Critical care was necessary to treat or prevent imminent or life-threatening deterioration. Critical care was time spent personally by me on the following activities: development of treatment plan with patient and/or surrogate as well as nursing, discussions with consultants, evaluation of patient's response to treatment, examination of patient, obtaining history from patient or surrogate, ordering and performing treatments and interventions, ordering and review of laboratory studies, ordering and review of radiographic studies, pulse oximetry and re-evaluation of patient's condition.   Purvis SheffieldForrest Janelli Welling, MD 08/15/14 1630

## 2014-08-15 NOTE — Transfer of Care (Signed)
Immediate Anesthesia Transfer of Care Note  Patient: [name] [name]  Procedure(s) Performed: Procedure(s): EXPLORATORY LAPAROTOMY (N/A) REPAIR OF PERFORATED DUODENAL  ULCER (N/A)  Patient Location: PACU  Anesthesia Type:General  Level of Consciousness: sedated and responds to stimulation  Airway & Oxygen Therapy: Patient Spontanous Breathing and Patient connected to nasal cannula oxygen  Post-op Assessment: Report given to PACU RN and Post -op Vital signs reviewed and stable  Post vital signs: Reviewed and stable  Complications: No apparent anesthesia complications

## 2014-08-15 NOTE — Anesthesia Preprocedure Evaluation (Addendum)
Anesthesia Evaluation  Patient identified by MRN, date of birth, ID band Patient awake    Reviewed: Allergy & Precautions, H&P , NPO status , Patient's Chart, lab work & pertinent test results  History of Anesthesia Complications Negative for: history of anesthetic complications  Airway Mallampati: I TM Distance: >3 FB Neck ROM: Full    Dental  (+) Teeth Intact   Pulmonary neg sleep apnea, neg COPDneg recent URI, Current Smoker,  breath sounds clear to auscultation        Cardiovascular negative cardio ROS  Rhythm:Regular     Neuro/Psych negative neurological ROS  negative psych ROS   GI/Hepatic Neg liver ROS, Perforated duodenal ulcer with free air on ct   Endo/Other  negative endocrine ROS  Renal/GU negative Renal ROS     Musculoskeletal   Abdominal   Peds  Hematology negative hematology ROS (+)   Anesthesia Other Findings   Reproductive/Obstetrics                          Anesthesia Physical Anesthesia Plan  ASA: II and emergent  Anesthesia Plan: General   Post-op Pain Management:    Induction: Intravenous, Rapid sequence and Cricoid pressure planned  Airway Management Planned: Oral ETT  Additional Equipment: None  Intra-op Plan:   Post-operative Plan: Extubation in OR  Informed Consent: I have reviewed the patients History and Physical, chart, labs and discussed the procedure including the risks, benefits and alternatives for the proposed anesthesia with the patient or authorized representative who has indicated his/her understanding and acceptance.   Dental advisory given  Plan Discussed with: CRNA and Surgeon  Anesthesia Plan Comments:         Anesthesia Quick Evaluation

## 2014-08-15 NOTE — ED Provider Notes (Signed)
CSN: [id number]     Arrival date & time 08/15/14  1021 History   First MD Initiated Contact with Patient 08/15/14 1030     Chief Complaint  Patient presents with  . Abdominal Pain   Patient is a 38 y.o. male presenting with abdominal pain. The history is provided by the patient.  Abdominal Pain Pain location:  Generalized Pain quality: sharp   Pain radiates to:  Does not radiate Pain severity:  Severe Onset quality:  Sudden Duration:  3 days Timing:  Constant Progression:  Worsening Chronicity:  New Relieved by:  Nothing Worsened by:  Palpation and position changes Ineffective treatments:  None tried Associated symptoms: nausea   Associated symptoms: no chest pain, no chills, no constipation, no cough, no diarrhea, no dysuria, no fever, no hematemesis, no hematochezia, no hematuria, no melena, no shortness of breath, no sore throat and no vomiting    38 yo Asian M with no signficant PMH who presents with abdominal pain x 3 days. He denies h/o pancreatitis, abd trauma, hematemesis, melena, constipation, diarrhea, fever. No bloody stools. No suspicious food intake. No flank pain, hematuria or dysuria. No testicular pain or swelling.   History reviewed. No pertinent past medical history. History reviewed. No pertinent past surgical history. History reviewed. No pertinent family history. History  Substance Use Topics  . Smoking status: Current Some Day Smoker -- 0.50 packs/day  . Smokeless tobacco: Not on file  . Alcohol Use: 1.2 oz/week    2 Cans of beer per week    Review of Systems  Constitutional: Negative for fever and chills.  HENT: Negative for rhinorrhea and sore throat.   Eyes: Negative for visual disturbance.  Respiratory: Negative for cough and shortness of breath.   Cardiovascular: Negative for chest pain.  Gastrointestinal: Positive for nausea and abdominal pain. Negative for vomiting, diarrhea, constipation, melena, hematochezia and hematemesis.  Genitourinary:  Negative for dysuria and hematuria.  Musculoskeletal: Negative for back pain and neck pain.  Skin: Negative for rash.  Neurological: Negative for syncope and headaches.  Psychiatric/Behavioral: Negative for confusion.  All other systems reviewed and are negative.  Allergies  Review of patient's allergies indicates no known allergies.  Home Medications   Prior to Admission medications   Not on File   BP 149/87  Temp(Src) 97.8 F (36.6 C) (Oral)  Resp 20  SpO2 99% Physical Exam  Constitutional: He is oriented to person, place, and time. He appears well-developed and well-nourished. He appears distressed.  Appears in much pain  HENT:  Head: Normocephalic and atraumatic.  Mouth/Throat: Oropharynx is clear and moist.  Eyes: EOM are normal.  Neck: Neck supple. No JVD present.  Cardiovascular: Normal rate, regular rhythm, normal heart sounds and intact distal pulses.   Pulmonary/Chest: Effort normal and breath sounds normal.  Abdominal: Soft. He exhibits no distension. There is generalized tenderness. There is rigidity and guarding. There is no CVA tenderness. Hernia confirmed negative in the right inguinal area and confirmed negative in the left inguinal area.  Genitourinary: Right testis shows no swelling and no tenderness. Left testis shows no swelling and no tenderness.  Musculoskeletal: Normal range of motion. He exhibits no edema.  Neurological: He is alert and oriented to person, place, and time. No cranial nerve deficit.  Skin: Skin is warm and dry.  Psychiatric: His behavior is normal.    ED Course  Procedures (including critical care time) Labs Review Labs Reviewed  COMPREHENSIVE METABOLIC PANEL - Abnormal; Notable for the following:  Glucose, Bld 119 (*)    All other components within normal limits  CBC WITH DIFFERENTIAL  LIPASE, BLOOD  URINALYSIS, ROUTINE W REFLEX MICROSCOPIC  APTT  PROTIME-INR  LACTIC ACID, PLASMA  TYPE AND SCREEN    Imaging Review Ct  Abdomen Pelvis W Contrast  08/15/2014   CLINICAL DATA:  Three-day history of intermittent right upper quadrant pain  EXAM: CT ABDOMEN AND PELVIS WITH CONTRAST  TECHNIQUE: Multidetector CT imaging of the abdomen and pelvis was performed using the standard protocol following bolus administration of intravenous contrast. Oral contrast was also administered.  CONTRAST:  80mL OMNIPAQUE IOHEXOL 300 MG/ML  SOLN  COMPARISON:  Ultrasound abdomen March 02, 2011  FINDINGS: There is pneumoperitoneum. There is oral contrast tracking from the proximal duodenum into the fissure for the ligamentum teres consistent with perforated duodenal ulcer. There is pneumoperitoneum tracking along with this sella oral contrast from the duodenum. There is marked thickening of the wall of the proximal duodenum. Oral contrast is seen to pool along the wall of the anterior right abdomen.  Lung bases are clear.  No focal liver lesions are identified. There is no gallbladder wall thickening. There is no biliary duct dilatation.  Spleen, pancreas, and adrenals appear normal. Kidneys bilaterally show no mass or hydronephrosis on either side. There is no renal or ureteral calculus on either side.  In the pelvis, the urinary bladder is midline with normal wall thickness. There is ascites in the pelvis. Ascites is also seen tracking along the inferior liver. There is no pelvic mass. Appendix appears normal.  There is no evidence of bowel obstruction. There is no portal venous air.  There is no adenopathy or abscess in the abdomen or pelvis. Aorta appears unremarkable. 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 There are no blastic or lytic bone lesions.  IMPRESSION: Evidence of perforated duodenal ulcer with pneumoperitoneum an as well as oral contrast tracking from the duodenum through the fissure for the ligamentum teres to pool along the anterior aspect of the right abdomen. There is also free air tracking along  this region. There is marked thickening of the wall of the duodenum in this area of apparent ulcer perforation.  Study otherwise essentially unremarkable.  Critical Value/emergent results were called by telephone at the time of interpretation on 08/15/2014 at 1:20 pm to Dr. Maris Berger , who verbally acknowledged these results.   Electronically Signed   By: Bretta Bang M.D.   On: 08/15/2014 13:22   MDM   Final diagnoses:  Abdominal pain, epigastric  Perforated duodenal ulcer    38 yo Asian M presents with 3 days of abd pain. He is afebrile, VSS. Appears uncomfortable in pain. Abdomen rigid with guarding and TTP. Normal GU exam, doubt torsion or epididymitis. Bedside FAST exam neg for free fluid. CBC, CMP, lipase obtained and are unremarkable. Patient given zofran, dilaudid, IVFs.  Will obtain CT abd/pelvis w/ contrast to further evaluate. UA neg for blood or infection. CT demonstrates pneumoperitoneum and contrast tracking in abdominal cavity consistent with perforated ulcer. General Surgery consulted and will see patient. Currently hemodynamically stable. Type/screen, coags and lactic acid ordered. IVFs given. Patient taken to OR from ED. Stable prior to transfer.   Case discussed with Dr. Romeo Apple.  Maris Berger, MD  Maris Berger, MD 08/15/14 [phone]

## 2014-08-15 NOTE — ED Notes (Signed)
Pt c/o RUQ pain that comes and goes for 3 days, denies N/V/D.  LBM 08/14/14. Pt sts he has not taken anything for pain. Pt sts pain is 8/10.

## 2014-08-16 LAB — CBC
HCT: 39.3 % (ref 39.0–52.0)
HEMOGLOBIN: 13.5 g/dL (ref 13.0–17.0)
MCH: 31.3 pg (ref 26.0–34.0)
MCHC: 34.4 g/dL (ref 30.0–36.0)
MCV: 91 fL (ref 78.0–100.0)
Platelets: 156 10*3/uL (ref 150–400)
RBC: 4.32 MIL/uL (ref 4.22–5.81)
RDW: 12.2 % (ref 11.5–15.5)
WBC: 11.2 10*3/uL — AB (ref 4.0–10.5)

## 2014-08-16 LAB — BASIC METABOLIC PANEL
ANION GAP: 9 (ref 5–15)
BUN: 8 mg/dL (ref 6–23)
CALCIUM: 8.3 mg/dL — AB (ref 8.4–10.5)
CHLORIDE: 104 meq/L (ref 96–112)
CO2: 28 meq/L (ref 19–32)
Creatinine, Ser: 0.88 mg/dL (ref 0.50–1.35)
GFR calc Af Amer: >90 mL/min (ref >90)
GFR calc non Af Amer: >90 mL/min (ref >90)
GLUCOSE: 115 mg/dL — AB (ref 70–99)
POTASSIUM: 4.1 meq/L (ref 3.7–5.3)
SODIUM: 141 meq/L (ref 137–147)

## 2014-08-16 NOTE — Progress Notes (Signed)
Up in chair. Mobilize. NGT. Zosyn/Diflucan P CXs. Patient examined and I agree with the assessment and plan  Violeta GelinasBurke Tamyka Bezio, MD, MPH, FACS Trauma: [phone] General Surgery: [phone]  08/16/2014 1:23 PM

## 2014-08-16 NOTE — Progress Notes (Signed)
Patient ID: [name] [name], male   DOB: 1976-04-21, 38 y.o.   MRN: [id number] 1 Day Post-Op  Subjective: Pt laying in bed and states his pain is controlled.  No interpretor in the room currently.  Objective: Vital signs in last 24 hours: Temp:  [97.3 F (36.3 C)-100 F (37.8 C)] 99.2 F (37.3 C) (10/02 0513) Pulse Rate:  [71-94] 80 (10/02 0513) Resp:  [12-29] 17 (10/02 0738) BP: (119-183)/(68-117) 131/78 mmHg (10/02 0513) SpO2:  [97 %-100 %] 99 % (10/02 0738)    Intake/Output from previous day: 10/01 0701 - 10/02 0700 In: 2948 [I.V.:2948] Out: 3400 [Urine:3050; Emesis/NG output:300; Blood:50] Intake/Output this shift:    PE: Abd: soft, less tender, wound is clean, dressing has old blood present, but no active bleeding, absent BS, NGT with bilious output Heart: regular Lungs: CTAB  Lab Results:   Recent Labs  08/15/14 1115 08/16/14 0357  WBC 7.6 11.2*  HGB 14.9 13.5  HCT 42.7 39.3  PLT 156 156   BMET  Recent Labs  08/15/14 1115 08/16/14 0357  NA 138 141  K 3.7 4.1  CL 99 104  CO2 26 28  GLUCOSE 119* 115*  BUN 15 8  CREATININE 0.61 0.88  CALCIUM 8.9 8.3*   PT/INR  Recent Labs  08/15/14 1331  LABPROT 13.1  INR 0.99   CMP     Component Value Date/Time   NA 141 08/16/2014 0357   K 4.1 08/16/2014 0357   CL 104 08/16/2014 0357   CO2 28 08/16/2014 0357   GLUCOSE 115* 08/16/2014 0357   BUN 8 08/16/2014 0357   CREATININE 0.88 08/16/2014 0357   CALCIUM 8.3* 08/16/2014 0357   PROT 7.3 08/15/2014 1115   ALBUMIN 3.8 08/15/2014 1115   AST 22 08/15/2014 1115   ALT 18 08/15/2014 1115   ALKPHOS 46 08/15/2014 1115   BILITOT 0.3 08/15/2014 1115   GFRNONAA >90 08/16/2014 0357   GFRAA >90 08/16/2014 0357   Lipase     Component Value Date/Time   LIPASE 31 08/15/2014 1115       Studies/Results: Ct Abdomen Pelvis W Contrast  08/15/2014   CLINICAL DATA:  Three-day history of intermittent right upper quadrant pain  EXAM: CT ABDOMEN AND PELVIS WITH CONTRAST  TECHNIQUE:  Multidetector CT imaging of the abdomen and pelvis was performed using the standard protocol following bolus administration of intravenous contrast. Oral contrast was also administered.  CONTRAST:  80mL OMNIPAQUE IOHEXOL 300 MG/ML  SOLN  COMPARISON:  Ultrasound abdomen March 02, 2011  FINDINGS: There is pneumoperitoneum. There is oral contrast tracking from the proximal duodenum into the fissure for the ligamentum teres consistent with perforated duodenal ulcer. There is pneumoperitoneum tracking along with this sella oral contrast from the duodenum. There is marked thickening of the wall of the proximal duodenum. Oral contrast is seen to pool along the wall of the anterior right abdomen.  Lung bases are clear.  No focal liver lesions are identified. There is no gallbladder wall thickening. There is no biliary duct dilatation.  Spleen, pancreas, and adrenals appear normal. Kidneys bilaterally show no mass or hydronephrosis on either side. There is no renal or ureteral calculus on either side.  In the pelvis, the urinary bladder is midline with normal wall thickness. There is ascites in the pelvis. Ascites is also seen tracking along the inferior liver. There is no pelvic mass. Appendix appears normal.  There is no evidence of bowel obstruction. There is no portal venous air.  There is no  adenopathy or abscess in the abdomen or pelvis. Aorta appears unremarkable. 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 There are no blastic or lytic bone lesions.  IMPRESSION: Evidence of perforated duodenal ulcer with pneumoperitoneum an as well as oral contrast tracking from the duodenum through the fissure for the ligamentum teres to pool along the anterior aspect of the right abdomen. There is also free air tracking along this region. There is marked thickening of the wall of the duodenum in this area of apparent ulcer perforation.  Study otherwise essentially unremarkable.  Critical  Value/emergent results were called by telephone at the time of interpretation on 08/15/2014 at 1:20 pm to Dr. Maris BergerJONAH GUNALDA , who verbally acknowledged these results.   Electronically Signed   By: Bretta BangWilliam  Woodruff M.D.   On: 08/15/2014 13:22    Anti-infectives: Anti-infectives   Start     Dose/Rate Route Frequency Ordered Stop   08/16/14 1500  fluconazole (DIFLUCAN) IVPB 200 mg     200 mg 100 mL/hr over 60 Minutes Intravenous Every 24 hours 08/15/14 1836     08/15/14 2300  piperacillin-tazobactam (ZOSYN) IVPB 3.375 g     3.375 g 12.5 mL/hr over 240 Minutes Intravenous Every 8 hours 08/15/14 1836     08/15/14 1400  piperacillin-tazobactam (ZOSYN) IVPB 3.375 g  Status:  Discontinued     3.375 g 12.5 mL/hr over 240 Minutes Intravenous 3 times per day 08/15/14 1356 08/15/14 1836   08/15/14 1400  [MAR Hold]  fluconazole (DIFLUCAN) IVPB 200 mg     (On MAR Hold since 08/15/14 1427)   200 mg 100 mL/hr over 60 Minutes Intravenous  Once 08/15/14 1356 08/15/14 1518       Assessment/Plan  1. POD 1, s/p ex lap with primary repair of perforated duodenal ulcer with graham patch  Plan: 1. Continue NGT until post op ileus resolves.  Dr. Janee Mornhompson does not feel the patient will need a swallow study prior to NGT removal in 3-5 days. 2. Dc foley today 3. Start lovenox today/SCDs 4. Mobilize and pulm toilet  5. Begin NS WD dressing changes today   LOS: 1 day    Lashawn Bromwell E 08/16/2014, 8:44 AM Pager: [phone]

## 2014-08-17 NOTE — Progress Notes (Signed)
2 Days Post-Op  Subjective: Stable pain control OK   Objective: Vital signs in last 24 hours: Temp:  [97.6 F (36.4 C)-99.3 F (37.4 C)] 98.7 F (37.1 C) (10/03 0510) Pulse Rate:  [65-77] 71 (10/03 0510) Resp:  [12-29] 18 (10/03 0745) BP: (112-136)/(75-87) 132/85 mmHg (10/03 0510) SpO2:  [96 %-100 %] 100 % (10/03 0745) Last BM Date:  (PTA)  Intake/Output from previous day: 10/02 0701 - 10/03 0700 In: 3447.1 [I.V.:3427.1; NG/GT:20] Out: 4700 [Urine:4300; Emesis/NG output:400] Intake/Output this shift:    Incision/Wound:clean and open flat soft abdomen  Lab Results:   Recent Labs  08/15/14 1115 08/16/14 0357  WBC 7.6 11.2*  HGB 14.9 13.5  HCT 42.7 39.3  PLT 156 156   BMET  Recent Labs  08/15/14 1115 08/16/14 0357  NA 138 141  K 3.7 4.1  CL 99 104  CO2 26 28  GLUCOSE 119* 115*  BUN 15 8  CREATININE 0.61 0.88  CALCIUM 8.9 8.3*   PT/INR  Recent Labs  08/15/14 1331  LABPROT 13.1  INR 0.99   ABG No results found for this basename: PHART, PCO2, PO2, HCO3,  in the last 72 hours  Studies/Results: Ct Abdomen Pelvis W Contrast  08/15/2014   CLINICAL DATA:  Three-day history of intermittent right upper quadrant pain  EXAM: CT ABDOMEN AND PELVIS WITH CONTRAST  TECHNIQUE: Multidetector CT imaging of the abdomen and pelvis was performed using the standard protocol following bolus administration of intravenous contrast. Oral contrast was also administered.  CONTRAST:  80mL OMNIPAQUE IOHEXOL 300 MG/ML  SOLN  COMPARISON:  Ultrasound abdomen March 02, 2011  FINDINGS: There is pneumoperitoneum. There is oral contrast tracking from the proximal duodenum into the fissure for the ligamentum teres consistent with perforated duodenal ulcer. There is pneumoperitoneum tracking along with this sella oral contrast from the duodenum. There is marked thickening of the wall of the proximal duodenum. Oral contrast is seen to pool along the wall of the anterior right abdomen.  Lung bases  are clear.  No focal liver lesions are identified. There is no gallbladder wall thickening. There is no biliary duct dilatation.  Spleen, pancreas, and adrenals appear normal. Kidneys bilaterally show no mass or hydronephrosis on either side. There is no renal or ureteral calculus on either side.  In the pelvis, the urinary bladder is midline with normal wall thickness. There is ascites in the pelvis. Ascites is also seen tracking along the inferior liver. There is no pelvic mass. Appendix appears normal.  There is no evidence of bowel obstruction. There is no portal venous air.  There is no adenopathy or abscess in the abdomen or pelvis. Aorta appears unremarkable. 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 There are no blastic or lytic bone lesions.  IMPRESSION: Evidence of perforated duodenal ulcer with pneumoperitoneum an as well as oral contrast tracking from the duodenum through the fissure for the ligamentum teres to pool along the anterior aspect of the right abdomen. There is also free air tracking along this region. There is marked thickening of the wall of the duodenum in this area of apparent ulcer perforation.  Study otherwise essentially unremarkable.  Critical Value/emergent results were called by telephone at the time of interpretation on 08/15/2014 at 1:20 pm to Dr. Maris BergerJONAH GUNALDA , who verbally acknowledged these results.   Electronically Signed   By: Bretta BangWilliam  Woodruff M.D.   On: 08/15/2014 13:22    Anti-infectives: Anti-infectives   Start     Dose/Rate  Route Frequency Ordered Stop   08/16/14 1500  fluconazole (DIFLUCAN) IVPB 200 mg     200 mg 100 mL/hr over 60 Minutes Intravenous Every 24 hours 08/15/14 1836     08/15/14 2300  piperacillin-tazobactam (ZOSYN) IVPB 3.375 g     3.375 g 12.5 mL/hr over 240 Minutes Intravenous Every 8 hours 08/15/14 1836     08/15/14 1400  piperacillin-tazobactam (ZOSYN) IVPB 3.375 g  Status:  Discontinued     3.375  g 12.5 mL/hr over 240 Minutes Intravenous 3 times per day 08/15/14 1356 08/15/14 1836   08/15/14 1400  [MAR Hold]  fluconazole (DIFLUCAN) IVPB 200 mg     (On MAR Hold since 08/15/14 1427)   200 mg 100 mL/hr over 60 Minutes Intravenous  Once 08/15/14 1356 08/15/14 1518      Assessment/Plan: s/p Procedure(s): EXPLORATORY LAPAROTOMY (N/A) REPAIR OF PERFORATED DUODENAL  ULCER (N/A) OOB Keep NGT / NPO for now  LOS: 2 days    Marney Treloar A. 08/17/2014

## 2014-08-18 NOTE — Progress Notes (Signed)
3 Days Post-Op  Subjective: Doing ok very little out NGT.     Objective: Vital signs in last 24 hours: Temp:  [97.8 F (36.6 C)-98.1 F (36.7 C)] 98 F (36.7 C) (10/04 0519) Pulse Rate:  [55-67] 55 (10/04 0519) Resp:  [11-25] 24 (10/04 0719) BP: (122-147)/(74-91) 147/85 mmHg (10/04 0519) SpO2:  [96 %-100 %] 100 % (10/04 0719) Last BM Date:  (PTA)  Intake/Output from previous day: 10/03 0701 - 10/04 0700 In: 2646.3 [I.V.:2616.3; NG/GT:30] Out: 2825 [Urine:2625; Emesis/NG output:200] Intake/Output this shift:    Incision/Wound:OPEN AND CLEAN.   FLAT AND SOFT.   Lab Results:   Recent Labs  08/15/14 1115 08/16/14 0357  WBC 7.6 11.2*  HGB 14.9 13.5  HCT 42.7 39.3  PLT 156 156   BMET  Recent Labs  08/15/14 1115 08/16/14 0357  NA 138 141  K 3.7 4.1  CL 99 104  CO2 26 28  GLUCOSE 119* 115*  BUN 15 8  CREATININE 0.61 0.88  CALCIUM 8.9 8.3*   PT/INR  Recent Labs  08/15/14 1331  LABPROT 13.1  INR 0.99   ABG No results found for this basename: PHART, PCO2, PO2, HCO3,  in the last 72 hours  Studies/Results: No results found.  Anti-infectives: Anti-infectives   Start     Dose/Rate Route Frequency Ordered Stop   08/16/14 1500  fluconazole (DIFLUCAN) IVPB 200 mg     200 mg 100 mL/hr over 60 Minutes Intravenous Every 24 hours 08/15/14 1836     08/15/14 2300  piperacillin-tazobactam (ZOSYN) IVPB 3.375 g     3.375 g 12.5 mL/hr over 240 Minutes Intravenous Every 8 hours 08/15/14 1836     08/15/14 1400  piperacillin-tazobactam (ZOSYN) IVPB 3.375 g  Status:  Discontinued     3.375 g 12.5 mL/hr over 240 Minutes Intravenous 3 times per day 08/15/14 1356 08/15/14 1836   08/15/14 1400  [MAR Hold]  fluconazole (DIFLUCAN) IVPB 200 mg     (On MAR Hold since 08/15/14 1427)   200 mg 100 mL/hr over 60 Minutes Intravenous  Once 08/15/14 1356 08/15/14 1518      Assessment/Plan: s/p Procedure(s): EXPLORATORY LAPAROTOMY (N/A) REPAIR OF PERFORATED DUODENAL  ULCER  (N/A) D/C NGT OOB Recheck labs in am Cont PPI Keep NPO except ice chips  LOS: 3 days    Malini Flemings A. 08/18/2014

## 2014-08-18 NOTE — Progress Notes (Signed)
NG tube removed and patient tolerated well.  Will continue to monitor. Vanice Sarahhompson, Cherron Blitzer L

## 2014-08-19 ENCOUNTER — Encounter (HOSPITAL_COMMUNITY): Payer: Self-pay | Admitting: General Surgery

## 2014-08-19 LAB — CBC
HEMATOCRIT: 40.3 % (ref 39.0–52.0)
HEMOGLOBIN: 14.3 g/dL (ref 13.0–17.0)
MCH: 31.5 pg (ref 26.0–34.0)
MCHC: 35.5 g/dL (ref 30.0–36.0)
MCV: 88.8 fL (ref 78.0–100.0)
Platelets: 207 10*3/uL (ref 150–400)
RBC: 4.54 MIL/uL (ref 4.22–5.81)
RDW: 11.7 % (ref 11.5–15.5)
WBC: 5.7 10*3/uL (ref 4.0–10.5)

## 2014-08-19 LAB — COMPREHENSIVE METABOLIC PANEL
ALK PHOS: 59 U/L (ref 39–117)
ALT: 19 U/L (ref 0–53)
AST: 21 U/L (ref 0–37)
Albumin: 3.1 g/dL — ABNORMAL LOW (ref 3.5–5.2)
Anion gap: 11 (ref 5–15)
BILIRUBIN TOTAL: 0.5 mg/dL (ref 0.3–1.2)
BUN: 8 mg/dL (ref 6–23)
CHLORIDE: 102 meq/L (ref 96–112)
CO2: 27 mEq/L (ref 19–32)
CREATININE: 0.85 mg/dL (ref 0.50–1.35)
Calcium: 9.7 mg/dL (ref 8.4–10.5)
GFR calc Af Amer: >90 mL/min (ref >90)
GFR calc non Af Amer: >90 mL/min (ref >90)
GLUCOSE: 96 mg/dL (ref 70–99)
POTASSIUM: 4.6 meq/L (ref 3.7–5.3)
Sodium: 140 mEq/L (ref 137–147)
Total Protein: 7.5 g/dL (ref 6.0–8.3)

## 2014-08-19 MED ORDER — MORPHINE SULFATE 2 MG/ML IJ SOLN
2.0000 mg | INTRAMUSCULAR | Status: DC | PRN
  Administered 2014-08-19: 2 mg via INTRAVENOUS
  Filled 2014-08-19 (×2): qty 1

## 2014-08-19 MED ORDER — HYDROCODONE-ACETAMINOPHEN 5-325 MG PO TABS: 1.0000 | ORAL_TABLET | ORAL | Status: DC | PRN

## 2014-08-19 NOTE — Progress Notes (Signed)
Patient ID: [name] [name], male   DOB: Mar 22, 1976, 38 y.o.   MRN: [id number]     [facility] [place], [place] [address]    Phone: [phone] FAX: [phone]     Subjective: Passing flatus.  No BM.  Ambulating.  Little pain.  No n/v.  VSS.  Afebrile.   Objective:  Vital signs:  Filed Vitals:   08/19/14 0004 08/19/14 0200 08/19/14 0522 08/19/14 0523  BP:  112/63  116/66  Pulse:  59  50  Temp:  98 F (36.7 C)  97.6 F (36.4 C)  TempSrc:  Oral  Oral  Resp: 19 20 12 18   SpO2: 100% 100% 100% 99%    Last BM Date:  (PTA)  Intake/Output   Yesterday:    This shift: I/O last 3 completed shifts: In: 1200 [I.V.:1200] Out: 6333 [Urine:1325; Emesis/NG output:50]      Physical Exam: General: Pt awake/alert/oriented x4 in no acute distress Chest: cta. No chest wall pain w good excursion CV:  Pulses intact.  Regular rhythm MS: Normal AROM mjr joints.  No obvious deformity Abdomen: Soft.  Nondistended.  Non tender.  Midline wound is c/d/i.  No evidence of peritonitis.  No incarcerated hernias. Ext:  SCDs BLE.  No mjr edema.  No cyanosis Skin: No petechiae / purpura   Problem List:   Principal Problem:   Perforated viscus Active Problems:   Perforated duodenal ulcer    Results:   Labs: Results for orders placed during the hospital encounter of 08/15/14 (from the past 48 hour(s))  CBC     Status: None   Collection Time    08/19/14 12:22 AM      Result Value Ref Range   WBC 5.7  4.0 - 10.5 K/uL   RBC 4.54  4.22 - 5.81 MIL/uL   Hemoglobin 14.3  13.0 - 17.0 g/dL   HCT 40.3  39.0 - 52.0 %   MCV 88.8  78.0 - 100.0 fL   MCH 31.5  26.0 - 34.0 pg   MCHC 35.5  30.0 - 36.0 g/dL   RDW 11.7  11.5 - 15.5 %   Platelets 207  150 - 400 K/uL  COMPREHENSIVE METABOLIC PANEL     Status: Abnormal   Collection Time    08/19/14 12:22 AM      Result Value Ref Range   Sodium 140  137 - 147 mEq/L   Potassium 4.6  3.7 -  5.3 mEq/L   Chloride 102  96 - 112 mEq/L   CO2 27  19 - 32 mEq/L   Glucose, Bld 96  70 - 99 mg/dL   BUN 8  6 - 23 mg/dL   Creatinine, Ser 0.85  0.50 - 1.35 mg/dL   Calcium 9.7  8.4 - 10.5 mg/dL   Total Protein 7.5  6.0 - 8.3 g/dL   Albumin 3.1 (*) 3.5 - 5.2 g/dL   AST 21  0 - 37 U/L   ALT 19  0 - 53 U/L   Alkaline Phosphatase 59  39 - 117 U/L   Total Bilirubin 0.5  0.3 - 1.2 mg/dL   GFR calc non Af Amer >90  >90 mL/min   GFR calc Af Amer >90  >90 mL/min   Comment: (NOTE)     The eGFR has been calculated using the CKD EPI equation.     This calculation has not been validated in all clinical situations.  eGFR's persistently <90 mL/min signify possible Chronic Kidney     Disease.   Anion gap 11  5 - 15    Imaging / Studies: No results found.  Medications / Allergies:  Scheduled Meds: . enoxaparin (LOVENOX) injection  40 mg Subcutaneous Q24H  . fluconazole (DIFLUCAN) IV  200 mg Intravenous Q24H  . pantoprazole (PROTONIX) IV  40 mg Intravenous Q12H  . piperacillin-tazobactam (ZOSYN)  IV  3.375 g Intravenous Q8H   Continuous Infusions: . dextrose 5 % and 0.45 % NaCl with KCl 20 mEq/L 100 mL/hr at 08/19/14 0201   PRN Meds:.acetaminophen, HYDROcodone-acetaminophen, morphine injection  Antibiotics: Anti-infectives   Start     Dose/Rate Route Frequency Ordered Stop   08/16/14 1500  fluconazole (DIFLUCAN) IVPB 200 mg     200 mg 100 mL/hr over 60 Minutes Intravenous Every 24 hours 08/15/14 1836     08/15/14 2300  piperacillin-tazobactam (ZOSYN) IVPB 3.375 g     3.375 g 12.5 mL/hr over 240 Minutes Intravenous Every 8 hours 08/15/14 1836     08/15/14 1400  piperacillin-tazobactam (ZOSYN) IVPB 3.375 g  Status:  Discontinued     3.375 g 12.5 mL/hr over 240 Minutes Intravenous 3 times per day 08/15/14 1356 08/15/14 1836   08/15/14 1400  [MAR Hold]  fluconazole (DIFLUCAN) IVPB 200 mg     (On MAR Hold since 08/15/14 1427)   200 mg 100 mL/hr over 60 Minutes Intravenous  Once  08/15/14 1356 08/15/14 1518        Assessment/Plan POD#4 exploratory laparotomy, repair of perforated duodenal ulcer---Dr. Dennis Bast Post op ileus -allow for clears, per Dr. Grandville Silos pt does not need a study -DC PCA -SCd/lovenox -mobilize -NS wet to dry dressing changes  Erby Pian, ANP-BC [facility] Pager [phone](7A-4:30P) For consults and floor pages call [phone](7A-4:30P)  08/19/2014 8:04 AM

## 2014-08-20 ENCOUNTER — Encounter (HOSPITAL_COMMUNITY): Payer: Self-pay | Admitting: General Surgery

## 2014-08-20 LAB — ANAEROBIC CULTURE

## 2014-08-20 MED ORDER — HYDROCODONE-ACETAMINOPHEN 5-325 MG PO TABS: 1.0000 | ORAL_TABLET | Freq: Four times a day (QID) | ORAL | Status: AC | PRN

## 2014-08-20 MED ORDER — SODIUM CHLORIDE 0.9 % IJ SOLN: 3.0000 mL | INTRAMUSCULAR | Status: DC | PRN

## 2014-08-20 MED ORDER — OMEPRAZOLE 40 MG PO CPDR: 40.0000 mg | DELAYED_RELEASE_CAPSULE | Freq: Two times a day (BID) | ORAL | Status: AC

## 2014-08-20 MED ORDER — SODIUM CHLORIDE 0.9 % IJ SOLN
3.0000 mL | Freq: Two times a day (BID) | INTRAMUSCULAR | Status: DC
  Administered 2014-08-20: 3 mL via INTRAVENOUS

## 2014-08-20 MED ORDER — SODIUM CHLORIDE 0.9 % IV SOLN: 250.0000 mL | INTRAVENOUS | Status: DC | PRN

## 2014-08-20 NOTE — Discharge Summary (Signed)
  [facility] Discharge Summary   Patient ID: [name] ReedWin Puebla MRN: [id number] DOB/AGE: [birth year] 38 y.o.  Admit date: 08/15/2014 Discharge date: 08/20/2014  Admitting Diagnosis: Perforated duodenal ulcer  Discharge Diagnosis Patient Active Problem List   Diagnosis Date Noted  . Perforated viscus 08/15/2014  . Perforated duodenal ulcer 08/15/2014    Consultants None  Imaging: No results found.  Procedures Dr. Janee Mornhompson (08/15/14) - Exploratory laparotomy, repair of perforated duodenal ucler  Hospital Course:  38 yo [place] male who speaks "karen" who has been in the [place] for 6 years. His friend translates for him. He states he has had some upper abdominal pain for over a year. He did see someone for this and thinks he was started on maybe some reflux medication. He no longer takes this apparently. He started having worsening abdominal pain 3 days ago. It has been waxing and waning, until this morning when his pain significantly worsened. He did eat some rice this morning. He denies nausea or vomiting. He had a normal BM yesterday. He denies fevers or chills. He denies taking NSAIDs or ASA. He drinks 3 12oz beers a day and smokes everyday.  [facility] evaluation. He had a CT scan that revealed perforated duodenal ulcer with extravasation of contrast and pneumoperitoneum. We have been called for further evaluation.  Patient was admitted and underwent procedure listed above.  Tolerated procedure well and was transferred to the floor.  NG tube was maintained until bowel function returned.  NG was removed on 08/18/14 and started on clear liquids on 08/19/14.  Diet was advanced as tolerated.  On POD #5, the patient was voiding well, tolerating diet, ambulating well, pain well controlled, vital signs stable, incisions c/d/i and felt stable for discharge home.  Patient will follow up in our office in 2-3 weeks and knows to call with questions or concerns.  I'll  place NSAIDS and Aspirin on his list of allergies as contraindications given this perforated duodenal ulcer.  I will give him a prescription for Prilosec 40mg  BID, until he can see a PCP.  Will have HH set up for him to help him manage his midline wound which will need daily dressing changes.      Medication List         HYDROcodone-acetaminophen 5-325 MG per tablet  Commonly known as:  NORCO/VICODIN  Take 1-2 tablets by mouth every 6 (six) hours as needed for moderate pain or severe pain.     omeprazole 40 MG capsule  Commonly known as:  PRILOSEC  Take 1 capsule (40 mg total) by mouth 2 (two) times daily.         Follow-up Information   Follow up with Liz MaladyHOMPSON,BURKE E, MD On 09/05/2014. (For post-operation check, For wound re-check.  Arrive at 2:00pm to check in and fill out paperwork for a 2:30pm with Dr. Janee Mornhompson.)    Specialty:  General Surgery   Contact information:   [address] [place] [address] [phone]       Follow up with [facility]    . Schedule an appointment as soon as possible for a visit in 3 weeks. (For post-hospital follow up with a primary care provider)    Contact information:   [address] [place] [address] [phone]      Signed: Candiss NorseMegan Dort, PA-C [facility] [phone]  08/20/2014, 12:12 PM

## 2014-08-20 NOTE — Progress Notes (Signed)
Patient ID: [name] [name], male   DOB: [birth year], 37 y.o.   MRN: [id number]     [address] [place], [place] [address]    Phone: [phone] FAX: [phone]     Subjective: Had a BM.  Pain is minimal.  Ambulating.  Voiding.  VSS.  Afebrile.   Objective:  Vital signs:  Filed Vitals:   08/19/14 0900 08/19/14 1300 08/19/14 2223 08/20/14 0546  BP: 127/81 121/79 114/67 114/68  Pulse: 51 54 48 52  Temp: 97.9 F (36.6 C) 97.8 F (36.6 C) 98.1 F (36.7 C) 98 F (36.7 C)  TempSrc: Oral Oral Oral Oral  Resp: 15 14 15 12   SpO2: 100% 100% 100% 100%    Last BM Date:  (PTA)  Intake/Output   Yesterday:  10/05 5993 - 10/06 0700 In: 2173.3 [P.O.:240; I.V.:1933.3] Out: -  This shift:    I/O last 3 completed shifts: In: 2573.3 [P.O.:240; I.V.:2333.3] Out: -     Physical Exam:  General: Pt awake/alert/oriented x4 in no acute distress  Chest: cta. No chest wall pain w good excursion  CV: Pulses intact. Regular rhythm  MS: Normal AROM mjr joints. No obvious deformity  Abdomen: Soft. Nondistended. Non tender. Midline wound-superficial, beefy red, no drainage. No evidence of peritonitis. No incarcerated hernias.  Ext: SCDs BLE. No mjr edema. No cyanosis  Skin: No petechiae / purpura   Problem List:   Principal Problem:   Perforated viscus Active Problems:   Perforated duodenal ulcer    Results:   Labs: Results for orders placed during the hospital encounter of 08/15/14 (from the past 48 hour(s))  CBC     Status: None   Collection Time    08/19/14 12:22 AM      Result Value Ref Range   WBC 5.7  4.0 - 10.5 K/uL   RBC 4.54  4.22 - 5.81 MIL/uL   Hemoglobin 14.3  13.0 - 17.0 g/dL   HCT 40.3  39.0 - 52.0 %   MCV 88.8  78.0 - 100.0 fL   MCH 31.5  26.0 - 34.0 pg   MCHC 35.5  30.0 - 36.0 g/dL   RDW 11.7  11.5 - 15.5 %   Platelets 207  150 - 400 K/uL  COMPREHENSIVE METABOLIC PANEL     Status: Abnormal   Collection Time    08/19/14 12:22 AM      Result Value Ref Range   Sodium 140  137 - 147 mEq/L   Potassium 4.6  3.7 - 5.3 mEq/L   Chloride 102  96 - 112 mEq/L   CO2 27  19 - 32 mEq/L   Glucose, Bld 96  70 - 99 mg/dL   BUN 8  6 - 23 mg/dL   Creatinine, Ser 0.85  0.50 - 1.35 mg/dL   Calcium 9.7  8.4 - 10.5 mg/dL   Total Protein 7.5  6.0 - 8.3 g/dL   Albumin 3.1 (*) 3.5 - 5.2 g/dL   AST 21  0 - 37 U/L   ALT 19  0 - 53 U/L   Alkaline Phosphatase 59  39 - 117 U/L   Total Bilirubin 0.5  0.3 - 1.2 mg/dL   GFR calc non Af Amer >90  >90 mL/min   GFR calc Af Amer >90  >90 mL/min   Comment: (NOTE)     The eGFR has been calculated using the CKD EPI equation.  This calculation has not been validated in all clinical situations.     eGFR's persistently <90 mL/min signify possible Chronic Kidney     Disease.   Anion gap 11  5 - 15    Imaging / Studies: No results found.  Medications / Allergies:  Scheduled Meds: . enoxaparin (LOVENOX) injection  40 mg Subcutaneous Q24H  . fluconazole (DIFLUCAN) IV  200 mg Intravenous Q24H  . pantoprazole (PROTONIX) IV  40 mg Intravenous Q12H  . piperacillin-tazobactam (ZOSYN)  IV  3.375 g Intravenous Q8H   Continuous Infusions: . dextrose 5 % and 0.45 % NaCl with KCl 20 mEq/L 100 mL/hr at 08/20/14 0700   PRN Meds:.acetaminophen, HYDROcodone-acetaminophen, morphine injection  Antibiotics: Anti-infectives   Start     Dose/Rate Route Frequency Ordered Stop   08/16/14 1500  fluconazole (DIFLUCAN) IVPB 200 mg     200 mg 100 mL/hr over 60 Minutes Intravenous Every 24 hours 08/15/14 1836     08/15/14 2300  piperacillin-tazobactam (ZOSYN) IVPB 3.375 g     3.375 g 12.5 mL/hr over 240 Minutes Intravenous Every 8 hours 08/15/14 1836     08/15/14 1400  piperacillin-tazobactam (ZOSYN) IVPB 3.375 g  Status:  Discontinued     3.375 g 12.5 mL/hr over 240 Minutes Intravenous 3 times per day 08/15/14 1356 08/15/14 1836   08/15/14 1400  [MAR Hold]  fluconazole  (DIFLUCAN) IVPB 200 mg     (On MAR Hold since 08/15/14 1427)   200 mg 100 mL/hr over 60 Minutes Intravenous  Once 08/15/14 1356 08/15/14 1518       Assessment/Plan  POD#5 exploratory laparotomy, repair of perforated duodenal ulcer---Dr. Dennis Bast  Post op ileus  -full liquid diet and advance as tolerated, per Dr. Grandville Silos pt does not need a study  -SCd/lovenox  -mobilize  -NS wet to dry dressing changes -possible DC later today or tomorrow  Erby Pian, [facility] Pager [phone]) For consults and floor pages call [phone](7A-4:30P)  08/20/2014 7:47 AM

## 2014-08-20 NOTE — Discharge Instructions (Signed)
[facility]      [facility] [phone]  OPEN ABDOMINAL SURGERY: POST OP INSTRUCTIONS  Always review your discharge instruction sheet given to you by the facility where your surgery was performed.  IF YOU HAVE DISABILITY OR FAMILY LEAVE FORMS, YOU MUST BRING THEM TO THE OFFICE FOR PROCESSING.  PLEASE DO NOT GIVE THEM TO YOUR DOCTOR.  1. A prescription for pain medication may be given to you upon discharge.  Take your pain medication as prescribed, if needed.  If narcotic pain medicine is not needed, then you may take acetaminophen (Tylenol) or ibuprofen (Advil) as needed. 2. Take your usually prescribed medications unless otherwise directed. 3. If you need a refill on your pain medication, please contact your pharmacy. They will contact our office to request authorization.  Prescriptions will not be filled after 5pm or on week-ends. 4. You should follow a light diet the first few days after arrival home, such as soup and crackers, pudding, etc.unless your doctor has advised otherwise. A high-fiber, low fat diet can be resumed as tolerated.   Be sure to include lots of fluids daily. Most patients will experience some swelling and bruising on the chest and neck area.  Ice packs will help.  Swelling and bruising can take several days to resolve 5. Most patients will experience some swelling and bruising in the area of the incision. Ice pack will help. Swelling and bruising can take several days to resolve..  6. It is common to experience some constipation if taking pain medication after surgery.  Increasing fluid intake and taking a stool softener will usually help or prevent this problem from occurring.  A mild laxative (Milk of Magnesia or Miralax) should be taken according to package directions if there are no bowel movements after 48 hours. 7.  You may have steri-strips (small skin tapes) in place directly over the incision.  These strips should be left on the skin for 7-10 days.  If your  surgeon used skin glue on the incision, you may shower in 24 hours.  The glue will flake off over the next 2-3 weeks.  Any sutures or staples will be removed at the office during your follow-up visit. You may find that a light gauze bandage over your incision may keep your staples from being rubbed or pulled. You may shower and replace the bandage daily. 8. ACTIVITIES:  You may resume regular (light) daily activities beginning the next day--such as daily self-care, walking, climbing stairs--gradually increasing activities as tolerated.  You may have sexual intercourse when it is comfortable.  Refrain from any heavy lifting or straining until approved by your doctor. a. You may drive when you no longer are taking prescription pain medication, you can comfortably wear a seatbelt, and you can safely maneuver your car and apply brakes b. Return to Work: ___________________________________ 9. You should see your doctor in the office for a follow-up appointment approximately two weeks after your surgery.  Make sure that you call for this appointment within a day or two after you arrive home to insure a convenient appointment time. OTHER INSTRUCTIONS:  _____________________________________________________________ _____________________________________________________________  WHEN TO CALL YOUR DOCTOR: 1. Fever over 101.0 2. Inability to urinate 3. Nausea and/or vomiting 4. Extreme swelling or bruising 5. Continued bleeding from incision. 6. Increased pain, redness, or drainage from the incision. 7. Difficulty swallowing or breathing 8. Muscle cramping or spasms. 9. Numbness or tingling in hands or feet or around lips.  The clinic staff is available to   answer your questions during regular business hours.  Please don't hesitate to call and ask to speak to one of the nurses if you have concerns.  For further questions, please visit [email or web address]   

## 2014-08-20 NOTE — Progress Notes (Signed)
D/c to home with family. Heather with care management reviewed d/c with english speaking family and dressing changed.  VSS. Breathing regular and unlabored on room air.  Pain assesment negative

## 2014-08-20 NOTE — Care Management Note (Signed)
  Page 1 of 1   08/20/2014     3:11:11 PM CARE MANAGEMENT NOTE 08/20/2014  Patient:  [name] [name]   Account Number:  [id number]  Date Initiated:  08/20/2014  Documentation initiated by:  Ronny FlurryWILE,Latoshia Monrroy  Subjective/Objective Assessment:     Action/Plan:   Anticipated DC Date:  08/20/2014   Anticipated DC Plan:  HOME W HOME HEALTH SERVICES  In-house referral  Financial Counselor      DC Planning Services  CM consult  Medication Assistance  MATCH Program  [facility]      Choice offered to / List presented to:  C-1 Patient        HH arranged  HH-1 RN      [facility]  [facility]   Status of service:   Medicare Important Message given?   (If response is "NO", the following Medicare IM given date fields will be blank) Date Medicare IM given:   Medicare IM given by:   Date Additional Medicare IM given:   Additional Medicare IM given by:    Discharge Disposition:  HOME W HOME HEALTH SERVICES  Per UR Regulation:  Reviewed for med. necessity/level of care/duration of stay  If discussed at Long Length of Stay Meetings, dates discussed:   08/20/2014    Comments:  08-20-14 Explained MATCH letter, [facility] information , and home health  to patient and friends , via interpreter . Patient voiced understanding . Ronny FlurryHeather Chenoa Luddy RN BSN [phone]

## 2015-07-02 LAB — BODY FLUID CULTURE: CULTURE: NO GROWTH

## 2021-08-13 ENCOUNTER — Emergency Department (HOSPITAL_COMMUNITY): Payer: BC Managed Care – PPO

## 2021-08-13 ENCOUNTER — Other Ambulatory Visit: Payer: Self-pay

## 2021-08-13 ENCOUNTER — Inpatient Hospital Stay (HOSPITAL_COMMUNITY)
Admission: EM | Admit: 2021-08-13 | Discharge: 2021-08-16 | DRG: 896 | Disposition: A | Discharge status: Home / Self Care | Payer: BC Managed Care – PPO | Attending: Student | Admitting: Student

## 2021-08-13 DIAGNOSIS — F10931 Alcohol use, unspecified with withdrawal delirium: Secondary | ICD-10-CM

## 2021-08-13 DIAGNOSIS — M6282 Rhabdomyolysis: Secondary | ICD-10-CM | POA: Diagnosis present

## 2021-08-13 DIAGNOSIS — F17210 Nicotine dependence, cigarettes, uncomplicated: Secondary | ICD-10-CM | POA: Diagnosis present

## 2021-08-13 DIAGNOSIS — E871 Hypo-osmolality and hyponatremia: Secondary | ICD-10-CM | POA: Diagnosis present

## 2021-08-13 DIAGNOSIS — R7989 Other specified abnormal findings of blood chemistry: Secondary | ICD-10-CM | POA: Diagnosis present

## 2021-08-13 DIAGNOSIS — Z886 Allergy status to analgesic agent status: Secondary | ICD-10-CM

## 2021-08-13 DIAGNOSIS — Y929 Unspecified place or not applicable: Secondary | ICD-10-CM

## 2021-08-13 DIAGNOSIS — R296 Repeated falls: Secondary | ICD-10-CM | POA: Diagnosis present

## 2021-08-13 DIAGNOSIS — G9341 Metabolic encephalopathy: Secondary | ICD-10-CM | POA: Diagnosis present

## 2021-08-13 DIAGNOSIS — E876 Hypokalemia: Secondary | ICD-10-CM | POA: Diagnosis present

## 2021-08-13 DIAGNOSIS — I10 Essential (primary) hypertension: Secondary | ICD-10-CM | POA: Diagnosis present

## 2021-08-13 DIAGNOSIS — M79662 Pain in left lower leg: Secondary | ICD-10-CM | POA: Diagnosis present

## 2021-08-13 DIAGNOSIS — Y900 Blood alcohol level of less than 20 mg/100 ml: Secondary | ICD-10-CM | POA: Diagnosis present

## 2021-08-13 DIAGNOSIS — F10939 Alcohol use, unspecified with withdrawal, unspecified: Secondary | ICD-10-CM | POA: Diagnosis not present

## 2021-08-13 DIAGNOSIS — F10139 Alcohol abuse with withdrawal, unspecified: Secondary | ICD-10-CM | POA: Diagnosis not present

## 2021-08-13 DIAGNOSIS — M79661 Pain in right lower leg: Secondary | ICD-10-CM | POA: Diagnosis present

## 2021-08-13 DIAGNOSIS — Z20822 Contact with and (suspected) exposure to covid-19: Secondary | ICD-10-CM | POA: Diagnosis present

## 2021-08-13 DIAGNOSIS — D6959 Other secondary thrombocytopenia: Secondary | ICD-10-CM | POA: Diagnosis present

## 2021-08-13 DIAGNOSIS — Z79899 Other long term (current) drug therapy: Secondary | ICD-10-CM

## 2021-08-13 DIAGNOSIS — W19XXXA Unspecified fall, initial encounter: Secondary | ICD-10-CM | POA: Diagnosis present

## 2021-08-13 HISTORY — DX: Essential (primary) hypertension: I10

## 2021-08-13 LAB — URINALYSIS, ROUTINE W REFLEX MICROSCOPIC
Bilirubin Urine: NEGATIVE
Glucose, UA: NEGATIVE mg/dL
Hgb urine dipstick: NEGATIVE
Ketones, ur: NEGATIVE mg/dL
Leukocytes,Ua: NEGATIVE
Nitrite: NEGATIVE
Protein, ur: NEGATIVE mg/dL
Specific Gravity, Urine: 1.01 (ref 1.005–1.030)
pH: 7 (ref 5.0–8.0)

## 2021-08-13 LAB — COMPREHENSIVE METABOLIC PANEL
ALT: 66 U/L — ABNORMAL HIGH (ref 0–44)
AST: 83 U/L — ABNORMAL HIGH (ref 15–41)
Albumin: 4.2 g/dL (ref 3.5–5.0)
Alkaline Phosphatase: 63 U/L (ref 38–126)
Anion gap: 9 (ref 5–15)
BUN: 7 mg/dL (ref 6–20)
CO2: 28 mmol/L (ref 22–32)
Calcium: 9.3 mg/dL (ref 8.9–10.3)
Chloride: 98 mmol/L (ref 98–111)
Creatinine, Ser: 0.67 mg/dL (ref 0.61–1.24)
GFR, Estimated: >60 mL/min (ref >60)
Glucose, Bld: 97 mg/dL (ref 70–99)
Potassium: 3.3 mmol/L — ABNORMAL LOW (ref 3.5–5.1)
Sodium: 135 mmol/L (ref 135–145)
Total Bilirubin: 0.8 mg/dL (ref 0.3–1.2)
Total Protein: 7.1 g/dL (ref 6.5–8.1)

## 2021-08-13 LAB — CBC WITH DIFFERENTIAL/PLATELET
Abs Immature Granulocytes: 0.03 10*3/uL (ref 0.00–0.07)
Basophils Absolute: 0 10*3/uL (ref 0.0–0.1)
Basophils Relative: 0 %
Eosinophils Absolute: 0.1 10*3/uL (ref 0.0–0.5)
Eosinophils Relative: 2 %
HCT: 41.3 % (ref 39.0–52.0)
Hemoglobin: 14.2 g/dL (ref 13.0–17.0)
Immature Granulocytes: 1 %
Lymphocytes Relative: 18 %
Lymphs Abs: 1.1 10*3/uL (ref 0.7–4.0)
MCH: 33.2 pg (ref 26.0–34.0)
MCHC: 34.4 g/dL (ref 30.0–36.0)
MCV: 96.5 fL (ref 80.0–100.0)
Monocytes Absolute: 0.7 10*3/uL (ref 0.1–1.0)
Monocytes Relative: 11 %
Neutro Abs: 4.1 10*3/uL (ref 1.7–7.7)
Neutrophils Relative %: 68 %
Platelets: 127 10*3/uL — ABNORMAL LOW (ref 150–400)
RBC: 4.28 MIL/uL (ref 4.22–5.81)
RDW: 12.5 % (ref 11.5–15.5)
WBC: 6 10*3/uL (ref 4.0–10.5)
nRBC: 0 % (ref 0.0–0.2)

## 2021-08-13 LAB — ETHANOL: Alcohol, Ethyl (B): <10 mg/dL (ref <10)

## 2021-08-13 LAB — RAPID URINE DRUG SCREEN, HOSP PERFORMED
Amphetamines: NOT DETECTED
Barbiturates: NOT DETECTED
Benzodiazepines: NOT DETECTED
Cocaine: NOT DETECTED
Opiates: NOT DETECTED
Tetrahydrocannabinol: NOT DETECTED

## 2021-08-13 LAB — LIPASE, BLOOD: Lipase: 78 U/L — ABNORMAL HIGH (ref 11–51)

## 2021-08-13 MED ORDER — LORAZEPAM 2 MG/ML IJ SOLN: 2.0000 mg | Freq: Once | INTRAMUSCULAR | Status: DC

## 2021-08-13 MED ORDER — LORAZEPAM 2 MG/ML IJ SOLN
2.0000 mg | Freq: Once | INTRAMUSCULAR | Status: AC
  Administered 2021-08-13: 2 mg via INTRAVENOUS
  Filled 2021-08-13: qty 1

## 2021-08-13 NOTE — ED Notes (Signed)
Patient moved to a recliner for patient safety. Patient keeps trying to get up out wheelchair with very unsteady gait

## 2021-08-13 NOTE — ED Provider Notes (Signed)
Emergency Medicine Provider Triage Evaluation Note  [name] [name] , a 45 y.o. male  was evaluated in triage.  Pt complains of tremors after a fall.  He states that he has been dizzy and has fallen numerous times over the past couple of days.  He states he drinks a large amount of alcohol on a regular basis daily.  He has not had any alcohol since yesterday.  Reports associated chest pain and numbness to his extremities.  Review of Systems  Positive:  Negative: See above   Physical Exam  BP (!) 170/106   Pulse 84   Temp 99.4 F (37.4 C) (Oral)   Resp 16   Ht 5\' 2"  (1.575 m)   Wt 56 kg   SpO2 99%   BMI 22.58 kg/m  Gen:   Awake, no distress   Resp:  Normal effort  MSK:   Moves extremities without difficulty  Other:  Tremulous  Medical Decision Making  Medically screening exam initiated at 8:58 PM.  Appropriate orders placed.  [name] [name] was informed that the remainder of the evaluation will be completed by another provider, this initial triage assessment does not replace that evaluation, and the importance of remaining in the ED until their evaluation is complete.    , PA-C 08/13/21 2100    2101, MD 08/13/21 2224

## 2021-08-13 NOTE — ED Triage Notes (Signed)
Pt presents with uncontrollable shaking. BIBA via [facility]. Per medic, patient drinks huge amount of alcohol on a daily basis. Pt A&Ox4, but no complaints of unilateral weakness, numbness, but presents with elevated BP per medic. Pt presents with 18G IV in LUA. Also has hx of htn. W/out BP meds for 3 months.   188/120 HR 90s RR: 18 SpO2: 100% CBG: 139

## 2021-08-14 ENCOUNTER — Encounter (HOSPITAL_COMMUNITY): Payer: Self-pay | Admitting: Internal Medicine

## 2021-08-14 ENCOUNTER — Observation Stay (HOSPITAL_COMMUNITY): Payer: BC Managed Care – PPO

## 2021-08-14 DIAGNOSIS — R42 Dizziness and giddiness: Secondary | ICD-10-CM

## 2021-08-14 DIAGNOSIS — F10231 Alcohol dependence with withdrawal delirium: Secondary | ICD-10-CM

## 2021-08-14 DIAGNOSIS — R7989 Other specified abnormal findings of blood chemistry: Secondary | ICD-10-CM

## 2021-08-14 DIAGNOSIS — E871 Hypo-osmolality and hyponatremia: Secondary | ICD-10-CM | POA: Diagnosis present

## 2021-08-14 DIAGNOSIS — F10939 Alcohol use, unspecified with withdrawal, unspecified: Secondary | ICD-10-CM | POA: Diagnosis present

## 2021-08-14 DIAGNOSIS — E876 Hypokalemia: Secondary | ICD-10-CM | POA: Diagnosis present

## 2021-08-14 DIAGNOSIS — Z789 Other specified health status: Secondary | ICD-10-CM

## 2021-08-14 DIAGNOSIS — F10139 Alcohol abuse with withdrawal, unspecified: Secondary | ICD-10-CM | POA: Diagnosis present

## 2021-08-14 DIAGNOSIS — M6282 Rhabdomyolysis: Secondary | ICD-10-CM | POA: Diagnosis present

## 2021-08-14 DIAGNOSIS — Y92009 Unspecified place in unspecified non-institutional (private) residence as the place of occurrence of the external cause: Secondary | ICD-10-CM

## 2021-08-14 DIAGNOSIS — Z20822 Contact with and (suspected) exposure to covid-19: Secondary | ICD-10-CM | POA: Diagnosis present

## 2021-08-14 DIAGNOSIS — Y900 Blood alcohol level of less than 20 mg/100 ml: Secondary | ICD-10-CM | POA: Diagnosis present

## 2021-08-14 DIAGNOSIS — R296 Repeated falls: Secondary | ICD-10-CM | POA: Diagnosis present

## 2021-08-14 DIAGNOSIS — F17200 Nicotine dependence, unspecified, uncomplicated: Secondary | ICD-10-CM

## 2021-08-14 DIAGNOSIS — F17210 Nicotine dependence, cigarettes, uncomplicated: Secondary | ICD-10-CM | POA: Diagnosis present

## 2021-08-14 DIAGNOSIS — R072 Precordial pain: Secondary | ICD-10-CM

## 2021-08-14 DIAGNOSIS — R079 Chest pain, unspecified: Secondary | ICD-10-CM

## 2021-08-14 DIAGNOSIS — Z79899 Other long term (current) drug therapy: Secondary | ICD-10-CM | POA: Diagnosis not present

## 2021-08-14 DIAGNOSIS — M79662 Pain in left lower leg: Secondary | ICD-10-CM | POA: Diagnosis present

## 2021-08-14 DIAGNOSIS — Z886 Allergy status to analgesic agent status: Secondary | ICD-10-CM | POA: Diagnosis not present

## 2021-08-14 DIAGNOSIS — Y929 Unspecified place or not applicable: Secondary | ICD-10-CM | POA: Diagnosis not present

## 2021-08-14 DIAGNOSIS — W19XXXA Unspecified fall, initial encounter: Secondary | ICD-10-CM

## 2021-08-14 DIAGNOSIS — G9341 Metabolic encephalopathy: Secondary | ICD-10-CM | POA: Diagnosis present

## 2021-08-14 DIAGNOSIS — M79661 Pain in right lower leg: Secondary | ICD-10-CM | POA: Diagnosis present

## 2021-08-14 DIAGNOSIS — F10239 Alcohol dependence with withdrawal, unspecified: Secondary | ICD-10-CM

## 2021-08-14 DIAGNOSIS — D6959 Other secondary thrombocytopenia: Secondary | ICD-10-CM | POA: Diagnosis present

## 2021-08-14 DIAGNOSIS — I10 Essential (primary) hypertension: Secondary | ICD-10-CM | POA: Diagnosis present

## 2021-08-14 LAB — HIV ANTIBODY (ROUTINE TESTING W REFLEX): HIV Screen 4th Generation wRfx: NONREACTIVE

## 2021-08-14 LAB — CBC WITH DIFFERENTIAL/PLATELET
Abs Immature Granulocytes: 0.02 10*3/uL (ref 0.00–0.07)
Basophils Absolute: 0 10*3/uL (ref 0.0–0.1)
Basophils Relative: 0 %
Eosinophils Absolute: 0.2 10*3/uL (ref 0.0–0.5)
Eosinophils Relative: 3 %
HCT: 43.9 % (ref 39.0–52.0)
Hemoglobin: 14.9 g/dL (ref 13.0–17.0)
Immature Granulocytes: 0 %
Lymphocytes Relative: 21 %
Lymphs Abs: 1 10*3/uL (ref 0.7–4.0)
MCH: 32.8 pg (ref 26.0–34.0)
MCHC: 33.9 g/dL (ref 30.0–36.0)
MCV: 96.7 fL (ref 80.0–100.0)
Monocytes Absolute: 0.7 10*3/uL (ref 0.1–1.0)
Monocytes Relative: 13 %
Neutro Abs: 3 10*3/uL (ref 1.7–7.7)
Neutrophils Relative %: 63 %
Platelets: 124 10*3/uL — ABNORMAL LOW (ref 150–400)
RBC: 4.54 MIL/uL (ref 4.22–5.81)
RDW: 12.4 % (ref 11.5–15.5)
WBC: 4.9 10*3/uL (ref 4.0–10.5)
nRBC: 0 % (ref 0.0–0.2)

## 2021-08-14 LAB — TROPONIN I (HIGH SENSITIVITY): Troponin I (High Sensitivity): 11 ng/L (ref <18)

## 2021-08-14 LAB — RESP PANEL BY RT-PCR (FLU A&B, COVID) ARPGX2
Influenza A by PCR: NEGATIVE
Influenza B by PCR: NEGATIVE
SARS Coronavirus 2 by RT PCR: NEGATIVE

## 2021-08-14 LAB — MAGNESIUM: Magnesium: 2 mg/dL (ref 1.7–2.4)

## 2021-08-14 LAB — HEPATIC FUNCTION PANEL
ALT: 58 U/L — ABNORMAL HIGH (ref 0–44)
AST: 66 U/L — ABNORMAL HIGH (ref 15–41)
Albumin: 3.9 g/dL (ref 3.5–5.0)
Alkaline Phosphatase: 59 U/L (ref 38–126)
Bilirubin, Direct: 0.2 mg/dL (ref 0.0–0.2)
Indirect Bilirubin: 0.8 mg/dL (ref 0.3–0.9)
Total Bilirubin: 1 mg/dL (ref 0.3–1.2)
Total Protein: 6.7 g/dL (ref 6.5–8.1)

## 2021-08-14 LAB — BASIC METABOLIC PANEL
Anion gap: 10 (ref 5–15)
BUN: 7 mg/dL (ref 6–20)
CO2: 27 mmol/L (ref 22–32)
Calcium: 9.3 mg/dL (ref 8.9–10.3)
Chloride: 100 mmol/L (ref 98–111)
Creatinine, Ser: 0.45 mg/dL — ABNORMAL LOW (ref 0.61–1.24)
GFR, Estimated: >60 mL/min (ref >60)
Glucose, Bld: 93 mg/dL (ref 70–99)
Potassium: 2.8 mmol/L — ABNORMAL LOW (ref 3.5–5.1)
Sodium: 137 mmol/L (ref 135–145)

## 2021-08-14 LAB — HEPATITIS PANEL, ACUTE
HCV Ab: NONREACTIVE
Hep A IgM: NONREACTIVE
Hep B C IgM: NONREACTIVE
Hepatitis B Surface Ag: NONREACTIVE

## 2021-08-14 LAB — CK: Total CK: 409 U/L — ABNORMAL HIGH (ref 49–397)

## 2021-08-14 LAB — ECHOCARDIOGRAM COMPLETE
Area-P 1/2: 3.27 cm2
Height: 62 in
S' Lateral: 2.6 cm
Weight: 1975.32 oz

## 2021-08-14 LAB — CBG MONITORING, ED
Glucose-Capillary: 100 mg/dL — ABNORMAL HIGH (ref 70–99)
Glucose-Capillary: 146 mg/dL — ABNORMAL HIGH (ref 70–99)

## 2021-08-14 LAB — PHOSPHORUS: Phosphorus: 3.9 mg/dL (ref 2.5–4.6)

## 2021-08-14 MED ORDER — LORAZEPAM 1 MG PO TABS: 0.0000 mg | ORAL_TABLET | Freq: Two times a day (BID) | ORAL | Status: DC

## 2021-08-14 MED ORDER — LORAZEPAM 2 MG/ML IJ SOLN: 0.0000 mg | Freq: Two times a day (BID) | INTRAMUSCULAR | Status: DC

## 2021-08-14 MED ORDER — CHLORDIAZEPOXIDE HCL 5 MG PO CAPS: 10.0000 mg | ORAL_CAPSULE | Freq: Every day | ORAL | Status: DC

## 2021-08-14 MED ORDER — THIAMINE HCL 100 MG/ML IJ SOLN
500.0000 mg | Freq: Once | INTRAVENOUS | Status: AC
  Administered 2021-08-14: 500 mg via INTRAVENOUS
  Filled 2021-08-14: qty 2

## 2021-08-14 MED ORDER — POTASSIUM CHLORIDE IN NACL 20-0.9 MEQ/L-% IV SOLN
INTRAVENOUS | Status: DC
  Filled 2021-08-14 (×3): qty 1000

## 2021-08-14 MED ORDER — LORAZEPAM 2 MG/ML IJ SOLN
0.0000 mg | Freq: Four times a day (QID) | INTRAMUSCULAR | Status: DC
  Administered 2021-08-15 (×3): 2 mg via INTRAVENOUS
  Filled 2021-08-14 (×3): qty 1

## 2021-08-14 MED ORDER — SODIUM CHLORIDE 0.9 % IV SOLN: INTRAVENOUS | Status: DC

## 2021-08-14 MED ORDER — THIAMINE HCL 100 MG/ML IJ SOLN
500.0000 mg | Freq: Once | INTRAMUSCULAR | Status: DC
  Filled 2021-08-14: qty 6

## 2021-08-14 MED ORDER — CHLORDIAZEPOXIDE HCL 5 MG PO CAPS: 10.0000 mg | ORAL_CAPSULE | ORAL | Status: DC

## 2021-08-14 MED ORDER — ONDANSETRON 4 MG PO TBDP: 4.0000 mg | ORAL_TABLET | Freq: Four times a day (QID) | ORAL | Status: DC | PRN

## 2021-08-14 MED ORDER — LORAZEPAM 1 MG PO TABS: 0.0000 mg | ORAL_TABLET | Freq: Four times a day (QID) | ORAL | Status: DC

## 2021-08-14 MED ORDER — THIAMINE HCL 100 MG PO TABS
100.0000 mg | ORAL_TABLET | Freq: Every day | ORAL | Status: DC
  Administered 2021-08-14 – 2021-08-16 (×3): 100 mg via ORAL
  Filled 2021-08-14 (×3): qty 1

## 2021-08-14 MED ORDER — LOPERAMIDE HCL 2 MG PO CAPS: 2.0000 mg | ORAL_CAPSULE | ORAL | Status: DC | PRN

## 2021-08-14 MED ORDER — FOLIC ACID 1 MG PO TABS
1.0000 mg | ORAL_TABLET | Freq: Every day | ORAL | Status: DC
  Administered 2021-08-14 – 2021-08-16 (×3): 1 mg via ORAL
  Filled 2021-08-14 (×3): qty 1

## 2021-08-14 MED ORDER — LORAZEPAM 2 MG/ML IJ SOLN
2.0000 mg | Freq: Once | INTRAMUSCULAR | Status: AC
  Administered 2021-08-14: 2 mg via INTRAVENOUS
  Filled 2021-08-14: qty 1

## 2021-08-14 MED ORDER — ENOXAPARIN SODIUM 40 MG/0.4ML IJ SOSY
40.0000 mg | PREFILLED_SYRINGE | INTRAMUSCULAR | Status: DC
  Administered 2021-08-14 – 2021-08-15 (×2): 40 mg via SUBCUTANEOUS
  Filled 2021-08-14 (×2): qty 0.4

## 2021-08-14 MED ORDER — CHLORDIAZEPOXIDE HCL 5 MG PO CAPS: 10.0000 mg | ORAL_CAPSULE | Freq: Three times a day (TID) | ORAL | Status: DC

## 2021-08-14 MED ORDER — POTASSIUM CHLORIDE CRYS ER 20 MEQ PO TBCR
40.0000 meq | EXTENDED_RELEASE_TABLET | ORAL | Status: AC
  Administered 2021-08-14 (×2): 40 meq via ORAL
  Filled 2021-08-14 (×2): qty 2

## 2021-08-14 MED ORDER — LORAZEPAM 2 MG/ML IJ SOLN: 1.0000 mg | INTRAMUSCULAR | Status: DC | PRN

## 2021-08-14 MED ORDER — LORAZEPAM 1 MG PO TABS: 1.0000 mg | ORAL_TABLET | ORAL | Status: DC | PRN

## 2021-08-14 MED ORDER — CHLORDIAZEPOXIDE HCL 5 MG PO CAPS
10.0000 mg | ORAL_CAPSULE | Freq: Four times a day (QID) | ORAL | Status: AC
Start: 2021-08-14 — End: 2021-08-15
  Administered 2021-08-14 – 2021-08-15 (×4): 10 mg via ORAL
  Filled 2021-08-14 (×4): qty 2

## 2021-08-14 MED ORDER — ADULT MULTIVITAMIN W/MINERALS CH
1.0000 | ORAL_TABLET | Freq: Every day | ORAL | Status: DC
  Administered 2021-08-14 – 2021-08-16 (×3): 1 via ORAL
  Filled 2021-08-14 (×3): qty 1

## 2021-08-14 MED ORDER — THIAMINE HCL 100 MG/ML IJ SOLN
100.0000 mg | Freq: Every day | INTRAMUSCULAR | Status: DC
  Filled 2021-08-14: qty 2

## 2021-08-14 MED ORDER — HYDRALAZINE HCL 20 MG/ML IJ SOLN: 10.0000 mg | INTRAMUSCULAR | Status: DC | PRN

## 2021-08-14 MED ORDER — ACETAMINOPHEN 325 MG PO TABS
650.0000 mg | ORAL_TABLET | Freq: Four times a day (QID) | ORAL | Status: DC | PRN
  Administered 2021-08-15 – 2021-08-16 (×2): 650 mg via ORAL
  Filled 2021-08-14 (×3): qty 2

## 2021-08-14 MED ORDER — LORAZEPAM 2 MG/ML IJ SOLN: 0.0000 mg | Freq: Four times a day (QID) | INTRAMUSCULAR | Status: DC

## 2021-08-14 NOTE — ED Notes (Addendum)
Pt Was found in triage room smoking with a cigarrete lighted up, cigarretes were removed from room

## 2021-08-14 NOTE — Evaluation (Signed)
Occupational Therapy Evaluation Patient Details Name: [name] [name] MRN: [id number] DOB: 18-Jun-1976 Today's Date: 08/14/2021   History of Present Illness 45 y.o. male with history of alcohol abuse, hypertension and prior history of perforated duodenal ulcer had come to the ER complaining of dizziness   Clinical Impression   Patient admitted for the diagnosis above.  PTA he lives alone in a second floor apartment, and works full time at [employer].  Patient does not drive, and needs no assist for mobility, ADL, or IADL.  He is lethargic, but able to participate without incident.  Mild unsteadiness noted, and he was orthostatic with sit to stand.  Currently, he is needing no assist for basic in room mobility and ADL from a sit/stand level.  PT eval is pending, but no post acute OT needs are anticipated.        Recommendations for follow up therapy are one component of a multi-disciplinary discharge planning process, led by the attending physician.  Recommendations may be updated based on patient status, additional functional criteria and insurance authorization.   Follow Up Recommendations  No OT follow up    Equipment Recommendations  None recommended by OT    Recommendations for Other Services       Precautions / Restrictions Precautions Precautions: Fall Restrictions Weight Bearing Restrictions: No      Mobility Bed Mobility Overal bed mobility: Independent               Patient Response: Cooperative  Transfers Overall transfer level: Independent               General transfer comment: in room mobility, did not take patient in the halls, PT eval pending    Balance Overall balance assessment: Mild deficits observed, not formally tested                                         ADL either performed or assessed with clinical judgement   ADL Overall ADL's : At baseline                                       General ADL Comments:  patient is moving well, and needs no assist for in room mobility or ADL from sit/stand level.     Vision Baseline Vision/History: 1 Wears glasses Patient Visual Report: No change from baseline       Perception     Praxis      Pertinent Vitals/Pain Pain Assessment: No/denies pain     Hand Dominance Right   Extremity/Trunk Assessment Upper Extremity Assessment Upper Extremity Assessment: Overall WFL for tasks assessed   Lower Extremity Assessment Lower Extremity Assessment: Overall WFL for tasks assessed   Cervical / Trunk Assessment Cervical / Trunk Assessment: Normal   Communication Communication Communication: Prefers language other than English   Cognition Arousal/Alertness: Lethargic;Suspect due to medications   Overall Cognitive Status: Within Functional Limits for tasks assessed                                 General Comments: following commands, able to make his needs known   General Comments  mild unsteadiness, with no c/o dizziness.    Exercises     Shoulder Instructions  Home Living Family/patient expects to be discharged to:: Private residence Living Arrangements: Alone Available Help at Discharge: Other (Comment) (none) Type of Home: Apartment Home Access: Stairs to enter Entrance Stairs-Number of Steps: flight   Home Layout: One level     Bathroom Shower/Tub: Chief Strategy Officer: Standard     Home Equipment: None          Prior Functioning/Environment Level of Independence: Independent        Comments: no assist from family or friends.  Walks to work, does not drive.  Works at [employer], 6days/wk        OT Problem List: Impaired balance (sitting and/or standing)      OT Treatment/Interventions:      OT Goals(Current goals can be found in the care plan section) Acute Rehab OT Goals Patient Stated Goal: Go home OT Goal Formulation: With patient Time For Goal Achievement: 08/14/21 Potential to  Achieve Goals: Good  OT Frequency:     Barriers to D/C:  None noted          Co-evaluation              AM-PAC OT "6 Clicks" Daily Activity     Outcome Measure Help from another person eating meals?: None Help from another person taking care of personal grooming?: None Help from another person toileting, which includes using toliet, bedpan, or urinal?: None Help from another person bathing (including washing, rinsing, drying)?: None Help from another person to put on and taking off regular upper body clothing?: None Help from another person to put on and taking off regular lower body clothing?: None 6 Click Score: 24   End of Session Nurse Communication: Mobility status  Activity Tolerance: Patient tolerated treatment well Patient left: in bed;with call bell/phone within reach  OT Visit Diagnosis: Unsteadiness on feet (R26.81)                Time: 3382-5053 OT Time Calculation (min): 17 min Charges:  OT General Charges $OT Visit: 1 Visit OT Evaluation $OT Eval Moderate Complexity: 1 Mod  08/14/2021  RP, OTR/L  Acute Rehabilitation Services  Office:  [phone]   Suzanna Obey 08/14/2021, 11:22 AM

## 2021-08-14 NOTE — Progress Notes (Signed)
PT Cancellation Note  Patient Details Name: [name] [name] MRN: [id number] DOB: 09/17/1976   Cancelled Treatment:    Reason Eval/Treat Not Completed: Patient at procedure or test/unavailable Pt currently having echo. Will follow up as schedule allows.   Cindee Salt, DPT  Acute Rehabilitation Services  Pager: [phone] Office: [phone]    [name] [name] 08/14/2021, 9:37 AM

## 2021-08-14 NOTE — ED Provider Notes (Signed)
[facility] EMERGENCY DEPARTMENT Provider Note   CSN: [id number] Arrival date & time: 08/13/21  2006     History Chief Complaint  Patient presents with   Delirium Tremens (DTS)    Presents with fine tremors    Chest Pain    Rodriguez Shin is a 45 y.o. male.  45 yo M with a chief complaints of altered mental status.  And thought to be in alcohol withdrawal upon arrival.  Patient had a prolonged wait of front and had worsening confusion.  Was given multiple doses of Ativan upfront.  Upon my exam of the patient he is quite somnolent.  Level 5 caveat.  The history is provided by the patient.  Chest Pain Illness Associated symptoms: chest pain       Past Medical History:  Diagnosis Date   Perforated duodenal ulcer (HCC) 08/15/14   s/p exp lap and repair    Patient Active Problem List   Diagnosis Date Noted   Perforated viscus 08/15/2014   Perforated duodenal ulcer (HCC) 08/15/2014    Past Surgical History:  Procedure Laterality Date   LAPAROTOMY N/A 08/15/2014   Procedure: EXPLORATORY LAPAROTOMY;  Surgeon: Violeta Gelinas, MD;  Location: [facility] OR;  Service: General;  Laterality: N/A;   REPAIR OF PERFORATED ULCER  08/15/2014   duodenal, exploratory lap   REPAIR OF PERFORATED ULCER N/A 08/15/2014   Procedure: REPAIR OF PERFORATED DUODENAL  ULCER;  Surgeon: Violeta Gelinas, MD;  Location: [facility] OR;  Service: General;  Laterality: N/A;       No family history on file.  Social History   Tobacco Use   Smoking status: Some Days    Packs/day: 0.50    Types: Cigarettes  Substance Use Topics   Alcohol use: Yes    Alcohol/week: 5.0 standard drinks    Types: 5 Shots of liquor per week   Drug use: No    Home Medications Prior to Admission medications   Medication Sig Start Date End Date Taking? Authorizing Provider  HYDROcodone-acetaminophen (NORCO/VICODIN) 5-325 MG per tablet Take 1-2 tablets by mouth every 6 (six) hours as needed for moderate pain or severe pain.  08/20/14   Nonie Hoyer, PA-C  omeprazole (PRILOSEC) 40 MG capsule Take 1 capsule (40 mg total) by mouth 2 (two) times daily. 08/20/14   Nonie Hoyer, PA-C    Allergies    Aspirin and Nsaids  Review of Systems   Review of Systems  Unable to perform ROS: Mental status change  Cardiovascular:  Positive for chest pain.   Physical Exam Updated Vital Signs BP (!) 145/98   Pulse 65   Temp 97.9 F (36.6 C) (Axillary)   Resp 16   Ht 5\' 2"  (1.575 m)   Wt 56 kg   SpO2 99%   BMI 22.58 kg/m   Physical Exam Vitals and nursing note reviewed.  Constitutional:      Appearance: He is well-developed.  HENT:     Head: Normocephalic and atraumatic.  Eyes:     Pupils: Pupils are equal, round, and reactive to light.  Neck:     Vascular: No JVD.  Cardiovascular:     Rate and Rhythm: Normal rate and regular rhythm.     Heart sounds: No murmur heard.   No friction rub. No gallop.  Pulmonary:     Effort: No respiratory distress.     Breath sounds: No wheezing.  Abdominal:     General: There is no distension.  Tenderness: There is no abdominal tenderness. There is no guarding or rebound.  Musculoskeletal:        General: Normal range of motion.     Cervical back: Normal range of motion and neck supple.  Skin:    Coloration: Skin is not pale.     Findings: No rash.  Neurological:     Comments: Snoring respirations  Psychiatric:        Behavior: Behavior normal.    ED Results / Procedures / Treatments   Labs (all labs ordered are listed, but only abnormal results are displayed) Labs Reviewed  COMPREHENSIVE METABOLIC PANEL - Abnormal; Notable for the following components:      Result Value   Potassium 3.3 (*)    AST 83 (*)    ALT 66 (*)    All other components within normal limits  LIPASE, BLOOD - Abnormal; Notable for the following components:   Lipase 78 (*)    All other components within normal limits  CBC WITH DIFFERENTIAL/PLATELET - Abnormal; Notable for the  following components:   Platelets 127 (*)    All other components within normal limits  CBG MONITORING, ED - Abnormal; Notable for the following components:   Glucose-Capillary 100 (*)    All other components within normal limits  RESP PANEL BY RT-PCR (FLU A&B, COVID) ARPGX2  ETHANOL  RAPID URINE DRUG SCREEN, HOSP PERFORMED  URINALYSIS, ROUTINE W REFLEX MICROSCOPIC  VITAMIN B1    EKG EKG Interpretation  Date/Time:  Thursday August 13 2021 21:06:27 EDT Ventricular Rate:  73 PR Interval:  156 QRS Duration: 76 QT Interval:  400 QTC Calculation: 440 R Axis:   62 Text Interpretation: Normal sinus rhythm Minimal voltage criteria for LVH, may be normal variant ( Sokolow-Lyon ) Nonspecific ST abnormality Abnormal ECG No old tracing to compare Confirmed by Melene Plan [phone]) on 08/14/2021 2:31:55 AM  Radiology DG Chest 2 View  Result Date: 08/13/2021 CLINICAL DATA:  Chest pain the EXAM: CHEST - 2 VIEW COMPARISON:  None. FINDINGS: The heart size and mediastinal contours are within normal limits. Both lungs are clear. The visualized skeletal structures are unremarkable. IMPRESSION: No active cardiopulmonary disease. Electronically Signed   By: Darliss Cheney M.D.   On: 08/13/2021 21:30   CT Head Wo Contrast  Result Date: 08/13/2021 CLINICAL DATA:  Tremors, alcohol abuse EXAM: CT HEAD WITHOUT CONTRAST TECHNIQUE: Contiguous axial images were obtained from the base of the skull through the vertex without intravenous contrast. COMPARISON:  None FINDINGS: Brain: No acute infarct or hemorrhage. Lateral ventricles and midline structures are unremarkable. No acute extra-axial fluid collections. No mass effect. Vascular: No hyperdense vessel or unexpected calcification. Skull: Normal. Negative for fracture or focal lesion. Sinuses/Orbits: No acute finding. Other: None. IMPRESSION: 1. No acute intracranial process. Electronically Signed   By: Sharlet Salina M.D.   On: 08/13/2021 21:25     Procedures Procedures   Medications Ordered in ED Medications  LORazepam (ATIVAN) injection 0-4 mg (0 mg Intravenous Not Given 08/14/21 0414)    Or  LORazepam (ATIVAN) tablet 0-4 mg ( Oral See Alternative 08/14/21 0414)  LORazepam (ATIVAN) injection 0-4 mg (has no administration in time range)    Or  LORazepam (ATIVAN) tablet 0-4 mg (has no administration in time range)  thiamine (B-1) injection 500 mg (has no administration in time range)  LORazepam (ATIVAN) injection 2 mg (2 mg Intravenous Given 08/13/21 2354)  LORazepam (ATIVAN) injection 2 mg (2 mg Intravenous Given 08/14/21 0051)  ED Course  I have reviewed the triage vital signs and the nursing notes.  Pertinent labs & imaging results that were available during my care of the patient were reviewed by me and considered in my medical decision making (see chart for details).    MDM Rules/Calculators/A&P                           45 yo M with a chief complaints of presumed alcohol withdrawal.  Was seen in the quick look process and lab work was ordered.  The patient unfortunately had worsening agitation while in the waiting room and was given two 2 mg boluses of Ativan.  Upon arrival to the room the patient is very sleepy.  He had a CT scan of the head that was negative.  We will give high-dose thiamine with the possibility of Wernicke's.  I am able to wake the patient up to the voice and shaking him.  There is a bit of a language barrier and so I am not able to effectively communicate with him.  I am unable to get in touch with family currently.  Our communication device unfortunately does not have an available [name] interpreter currently.  Patient likely in delirium trends and also possibly having Wernickes syndrome.  Will discuss with medicine for admission.  CRITICAL CARE Performed by: Rae Roam   Total critical care time: 35 minutes  Critical care time was exclusive of separately billable procedures and  treating other patients.  Critical care was necessary to treat or prevent imminent or life-threatening deterioration.  Critical care was time spent personally by me on the following activities: development of treatment plan with patient and/or surrogate as well as nursing, discussions with consultants, evaluation of patient's response to treatment, examination of patient, obtaining history from patient or surrogate, ordering and performing treatments and interventions, ordering and review of laboratory studies, ordering and review of radiographic studies, pulse oximetry and re-evaluation of patient's condition.  The patients results and plan were reviewed and discussed.   Any x-rays performed were independently reviewed by myself.   Differential diagnosis were considered with the presenting HPI.  Medications  LORazepam (ATIVAN) injection 0-4 mg (0 mg Intravenous Not Given 08/14/21 0414)    Or  LORazepam (ATIVAN) tablet 0-4 mg ( Oral See Alternative 08/14/21 0414)  LORazepam (ATIVAN) injection 0-4 mg (has no administration in time range)    Or  LORazepam (ATIVAN) tablet 0-4 mg (has no administration in time range)  thiamine (B-1) injection 500 mg (has no administration in time range)  LORazepam (ATIVAN) injection 2 mg (2 mg Intravenous Given 08/13/21 2354)  LORazepam (ATIVAN) injection 2 mg (2 mg Intravenous Given 08/14/21 0051)    Vitals:   08/14/21 0049 08/14/21 0315 08/14/21 0345 08/14/21 0400  BP: (!) 171/111 133/85 128/89 (!) 145/98  Pulse: (!) 107 68 68 65  Resp: 18 14 13 16   Temp:  97.9 F (36.6 C)    TempSrc:  Axillary    SpO2: 100% 98% 98% 99%  Weight:      Height:        Final diagnoses:  Delirium tremens (HCC)    Admission/ observation were discussed with the admitting physician, patient and/or family and they are comfortable with the plan.    Final Clinical Impression(s) / ED Diagnoses Final diagnoses:  Delirium tremens (HCC)    Rx / DC Orders ED Discharge  Orders     None  Melene Plan, DO 08/14/21 [phone]

## 2021-08-14 NOTE — Progress Notes (Signed)
PT Evaluation    08/14/21 1121  PT Visit Information  Last PT Received On 08/14/21  Assistance Needed +1  History of Present Illness 45 y.o. male admitted 9/29 secondary to dizziness. Thought to be going through alcohol withdrawal as well. PMH includes alcohol abuse, hypertension and prior history of perforated duodenal ulcer.  Precautions  Precautions Fall  Restrictions  Weight Bearing Restrictions No  Home Living  Family/patient expects to be discharged to: Private residence  Living Arrangements Alone  Available Help at Discharge Other (Comment) (none)  Type of Home Apartment  Home Access Stairs to enter  Entrance Stairs-Number of Steps flight  Entrance Stairs-Rails Right;Left;Can reach both  Home Layout One level  Bathroom Environmental health practitioner None  Prior Function  Level of Independence Independent  Comments Works at [employer]; walks to work  Geneticist, molecular Prefers language other than English (son present and interepreted when needed; pt has pretty good understanding of english)  Pain Assessment  Pain Assessment No/denies pain  Cognition  Arousal/Alertness Awake/alert  Behavior During Therapy WFL for tasks assessed/performed  Overall Cognitive Status Within Functional Limits for tasks assessed  Upper Extremity Assessment  Upper Extremity Assessment Defer to OT evaluation  Lower Extremity Assessment  Lower Extremity Assessment Overall WFL for tasks assessed  Cervical / Trunk Assessment  Cervical / Trunk Assessment Normal  Bed Mobility  Overal bed mobility Independent  Transfers  Overall transfer level Needs assistance  Equipment used None  Transfers Sit to/from Stand  Sit to Stand Min guard  General transfer comment Min guard for safety. No overt LOB noted.  Ambulation/Gait  Ambulation/Gait assistance Min guard  Gait Distance (Feet) 100 Feet  Assistive device None  Gait Pattern/deviations Step-through  pattern;Decreased stride length  General Gait Details Mild tremors noted with mild unsteadiness, but no overt LOB noted. Min guard for safety. Pt reports no dizziness.  Gait velocity Decreased  Balance  Overall balance assessment Mild deficits observed, not formally tested  PT - End of Session  Equipment Utilized During Treatment Gait belt  Activity Tolerance Patient tolerated treatment well  Patient left in bed;with call bell/phone within reach (on stretcher in ED)  Nurse Communication Mobility status  PT Assessment  PT Recommendation/Assessment Patient needs continued PT services  PT Visit Diagnosis Unsteadiness on feet (R26.81)  PT Problem List Decreased activity tolerance;Decreased balance;Decreased mobility;Decreased safety awareness;Decreased knowledge of precautions  Barriers to Discharge Decreased caregiver support  PT Plan  PT Frequency (ACUTE ONLY) Min 3X/week  PT Treatment/Interventions (ACUTE ONLY) DME instruction;Gait training;Stair training;Functional mobility training;Therapeutic activities;Therapeutic exercise;Balance training;Patient/family education  AM-PAC PT "6 Clicks" Mobility Outcome Measure (Version 2)  Help needed turning from your back to your side while in a flat bed without using bedrails? 4  Help needed moving from lying on your back to sitting on the side of a flat bed without using bedrails? 4  Help needed moving to and from a bed to a chair (including a wheelchair)? 3  Help needed standing up from a chair using your arms (e.g., wheelchair or bedside chair)? 3  Help needed to walk in hospital room? 3  Help needed climbing 3-5 steps with a railing?  3  6 Click Score 20  Consider Recommendation of Discharge To: Home with no services  Progressive Mobility  What is the highest level of mobility based on the progressive mobility assessment? Level 5 (Walks with assist in room/hall) - Balance while stepping forward/back and can walk in room  with assist - Complete   Mobility Ambulated with assistance in hallway  PT Recommendation  Follow Up Recommendations Supervision - Intermittent;No PT follow up (pending progression)  PT equipment None recommended by PT  Individuals Consulted  Consulted and Agree with Results and Recommendations Patient  Acute Rehab PT Goals  Patient Stated Goal Go home  PT Goal Formulation With patient  Time For Goal Achievement 08/28/21  Potential to Achieve Goals Good  PT Time Calculation  PT Start Time (ACUTE ONLY) 1119  PT Stop Time (ACUTE ONLY) 1134  PT Time Calculation (min) (ACUTE ONLY) 15 min  PT General Charges  $$ ACUTE PT VISIT 1 Visit  PT Evaluation  $PT Eval Low Complexity 1 Low   Pt admitted secondary to problem above with deficits above. Pt requiring min guard A for safety for transfers and gait this session. Mild unsteadiness noted with mild tremors. Anticipate pt will progress well as symptoms improve and will likely not require PT follow up. Will continue to follow acutely to maximize functional mobility independence and safety.   Farley Ly, PT, DPT  Acute Rehabilitation Services  Pager: [phone] Office: [phone]

## 2021-08-14 NOTE — ED Notes (Signed)
[name] [name] son was updated about pt. Condition and status. His name is [name] [name] and number is [phone]. Son indicated he may come to visit [name] [name] in the ED today

## 2021-08-14 NOTE — ED Notes (Signed)
Pt.'s condition has improved. He is a/ox4, denies pain, pt has been given PO and IV potassium and reports relief of leg pain. Pt is able to ambulate to void, he denies N/V/D, headache, hallucinations, itchiness, or discomfort. He has mild tremors that can only be felt through touch. Vitals have been stable. He is calm and cooperative, resting and watching TV at this time. His son is his primary contact and has been at the bedside however the son states that they have a distant relationship. Pt did report to the provider that his last drink was yesterday and he has about 4 drinks per day

## 2021-08-14 NOTE — ED Notes (Signed)
Dr. Kakrakandy at bedside. 

## 2021-08-14 NOTE — ED Notes (Signed)
This patient was brought back into the triage room to be closely monitored by staff due to the patients worsening condition while waiting in the waiting room. He appears diaphoretic, severe tremors and constantly attempted to get up out of recliner with unsteady gait. He also was yelling and speaking as if he was actively hallucinating. Maria PA ordered 2 mg IV ativan. Patient continued to hallucinate. He has gotten up out of recliner despite multiple attempts to remain seated. He appears very confused and even lit a cigarette in the triage room and was found smoking by the PA. Cigarette put out and lighter and cigarettes taken away. Shortly after he unzipped his pants and urinated in the recliner and into the floor and continues to climb out of recliner. Shanda Bumps charge RN aware that this patient needs to go back to next available room. 2mg  of Ativan ordered a second time by [place], DOLE. This RN will continue to monitor the patient until next room is available.

## 2021-08-14 NOTE — Progress Notes (Signed)
  Echocardiogram 2D Echocardiogram has been performed.  Delcie Roch 08/14/2021, 10:02 AM

## 2021-08-14 NOTE — ED Notes (Signed)
PT was unable to be reoriented, throw money on the floor of the room, pee in chair.

## 2021-08-14 NOTE — H&P (Addendum)
History and Physical    [name] [name] WUJ:[id number] DOB: [birth year] DOA: 08/13/2021  PCP: Pcp, No  Patient coming from: Home.  [name] Nurse, learning disability used.  History also obtained from ER physician.  Chief Complaint: Dizziness.  HPI: [name] [name] is a 45 y.o. male with history of alcohol abuse, hypertension and prior history of perforated duodenal ulcer had come to the ER complaining of dizziness.  Patient states over the last few days he has been getting dizzy and has been also drinking alcohol every day.  Denies any shortness of breath fever chills nausea vomiting or diarrhea.  Denies any abdominal pain.  ED Course: While waiting in the ER patient became restless and agitated and had signs of alcohol withdrawal.  Patient was brought into the ER and given Ativan a total of 4 mg IV following which patient's symptoms improved.  EKG shows LVH and normal sinus rhythm CT head is unremarkable chest x-ray unremarkable.  Labs show mild hypokalemia.  At the time of my exam patient states he did have some chest pain after he had a fall.  COVID test is pending.  Review of Systems: As per HPI, rest all negative.   Past Medical History:  Diagnosis Date   Hypertension    Perforated duodenal ulcer (HCC) 08/15/2014   s/p exp lap and repair    Past Surgical History:  Procedure Laterality Date   LAPAROTOMY N/A 08/15/2014   Procedure: EXPLORATORY LAPAROTOMY;  Surgeon: Violeta Gelinas, MD;  Location: [facility] OR;  Service: General;  Laterality: N/A;   REPAIR OF PERFORATED ULCER  08/15/2014   duodenal, exploratory lap   REPAIR OF PERFORATED ULCER N/A 08/15/2014   Procedure: REPAIR OF PERFORATED DUODENAL  ULCER;  Surgeon: Violeta Gelinas, MD;  Location: [facility] OR;  Service: General;  Laterality: N/A;     reports that he has been smoking cigarettes. He has been smoking an average of .5 packs per day. He has never used smokeless tobacco. He reports current alcohol use of about 5.0 standard drinks per week. He reports that he does  not use drugs.  Allergies  Allergen Reactions   Aspirin     Perforated duodenal ulcer   Nsaids     Perforated duodenal ulcer    Family History  Family history unknown: Yes    Prior to Admission medications   Medication Sig Start Date End Date Taking? Authorizing Provider  HYDROcodone-acetaminophen (NORCO/VICODIN) 5-325 MG per tablet Take 1-2 tablets by mouth every 6 (six) hours as needed for moderate pain or severe pain. 08/20/14   Nonie Hoyer, PA-C  omeprazole (PRILOSEC) 40 MG capsule Take 1 capsule (40 mg total) by mouth 2 (two) times daily. 08/20/14   Nonie Hoyer, PA-C    Physical Exam: Constitutional: Moderately built and nourished. Vitals:   08/14/21 0500 08/14/21 0600 08/14/21 0630 08/14/21 0632  BP: 125/84 (!) 143/93 137/89 (!) 143/93  Pulse: 71 81 63 83  Resp: 13 15 12    Temp:      TempSrc:      SpO2: 99% 97% 100%   Weight:      Height:       Eyes: Anicteric no pallor. ENMT: No discharge from the ears eyes nose and mouth. Neck: No mass felt.  No neck rigidity. Respiratory: No rhonchi or crepitations. Cardiovascular: S1-S2 heard. Abdomen: Soft nontender bowel sound present. Musculoskeletal: No edema. Skin: No rash. Neurologic: Patient is mildly sedated from the Ativan he received but is able to answer questions moving all extremities pupils  equal reactive light. Psychiatric: Mildly sedated.   Labs on Admission: I have personally reviewed following labs and imaging studies  CBC: Recent Labs  Lab 08/13/21 2104  WBC 6.0  NEUTROABS 4.1  HGB 14.2  HCT 41.3  MCV 96.5  PLT 127*   Basic Metabolic Panel: Recent Labs  Lab 08/13/21 2104  NA 135  K 3.3*  CL 98  CO2 28  GLUCOSE 97  BUN 7  CREATININE 0.67  CALCIUM 9.3   GFR: Estimated Creatinine Clearance: 92.9 mL/min (by C-G formula based on SCr of 0.67 mg/dL). Liver Function Tests: Recent Labs  Lab 08/13/21 2104  AST 83*  ALT 66*  ALKPHOS 63  BILITOT 0.8  PROT 7.1  ALBUMIN 4.2    Recent Labs  Lab 08/13/21 2104  LIPASE 78*   No results for input(s): AMMONIA in the last 168 hours. Coagulation Profile: No results for input(s): INR, PROTIME in the last 168 hours. Cardiac Enzymes: No results for input(s): CKTOTAL, CKMB, CKMBINDEX, TROPONINI in the last 168 hours. BNP (last 3 results) No results for input(s): PROBNP in the last 8760 hours. HbA1C: No results for input(s): HGBA1C in the last 72 hours. CBG: Recent Labs  Lab 08/14/21 0334  GLUCAP 100*   Lipid Profile: No results for input(s): CHOL, HDL, LDLCALC, TRIG, CHOLHDL, LDLDIRECT in the last 72 hours. Thyroid Function Tests: No results for input(s): TSH, T4TOTAL, FREET4, T3FREE, THYROIDAB in the last 72 hours. Anemia Panel: No results for input(s): VITAMINB12, FOLATE, FERRITIN, TIBC, IRON, RETICCTPCT in the last 72 hours. Urine analysis:    Component Value Date/Time   COLORURINE YELLOW 08/13/2021 2053   APPEARANCEUR CLEAR 08/13/2021 2053   LABSPEC 1.010 08/13/2021 2053   PHURINE 7.0 08/13/2021 2053   GLUCOSEU NEGATIVE 08/13/2021 2053   HGBUR NEGATIVE 08/13/2021 2053   BILIRUBINUR NEGATIVE 08/13/2021 2053   KETONESUR NEGATIVE 08/13/2021 2053   PROTEINUR NEGATIVE 08/13/2021 2053   UROBILINOGEN 0.2 08/15/2014 1226   NITRITE NEGATIVE 08/13/2021 2053   LEUKOCYTESUR NEGATIVE 08/13/2021 2053   Sepsis Labs: @LABRCNTIP (procalcitonin:4,lacticidven:4) )No results found for this or any previous visit (from the past 240 hour(s)).   Radiological Exams on Admission: DG Chest 2 View  Result Date: 08/13/2021 CLINICAL DATA:  Chest pain the EXAM: CHEST - 2 VIEW COMPARISON:  None. FINDINGS: The heart size and mediastinal contours are within normal limits. Both lungs are clear. The visualized skeletal structures are unremarkable. IMPRESSION: No active cardiopulmonary disease. Electronically Signed   By: 08/15/2021 M.D.   On: 08/13/2021 21:30   CT Head Wo Contrast  Result Date: 08/13/2021 CLINICAL DATA:   Tremors, alcohol abuse EXAM: CT HEAD WITHOUT CONTRAST TECHNIQUE: Contiguous axial images were obtained from the base of the skull through the vertex without intravenous contrast. COMPARISON:  None FINDINGS: Brain: No acute infarct or hemorrhage. Lateral ventricles and midline structures are unremarkable. No acute extra-axial fluid collections. No mass effect. Vascular: No hyperdense vessel or unexpected calcification. Skull: Normal. Negative for fracture or focal lesion. Sinuses/Orbits: No acute finding. Other: None. IMPRESSION: 1. No acute intracranial process. Electronically Signed   By: 08/15/2021 M.D.   On: 08/13/2021 21:25    EKG: Independently reviewed.  Normal sinus rhythm LVH.  Assessment/Plan Principal Problem:   Alcohol withdrawal (HCC)    Alcohol withdrawal presently on CIWA protocol thiamine.  Social work consult. Dizziness -patient on questioning states that he has been having dizziness off and on for the last few weeks.  We will gently hydrate closely monitor in telemetry  check orthostatics. Chest pain -patient states he had a fall few days ago after which he has been having some chest pain.  We will check cardiac markers 2D echo. Elevated LFTs likely from alcohol abuse.  Check acute hepatitis panel.  Check sonogram of the abdomen. Elevated lipase abdomen appears benign.  Gently hydrate follow closely.  Taking sonogram of the abdomen. Thrombocytopenia likely from alcoholism.  Follow CBC. Prior history of perforated duodenal ulcer.  Abdomen appears benign. History of hypertension not sure what medication patient is presently on for now I kept patient on hydralazine as needed. Mild hypokalemia for which I am repeating metabolic panel which is pending.  COVID test is pending.   DVT prophylaxis: Lovenox. Code Status: Full code. Family Communication: Discussed with patient. Disposition Plan: Home. Consults called: Child psychotherapist. Admission status: Observation.   Eduard Clos MD [facility] Pager [phone].  If 7PM-7AM, please contact night-coverage [email or web address] Password [facility]  08/14/2021, 6:34 AM

## 2021-08-14 NOTE — ED Notes (Signed)
Patient transported to Ultrasound 

## 2021-08-14 NOTE — Progress Notes (Signed)
PROGRESS NOTE  [name] [name] GYK:[id number] DOB: 1976/02/21   PCP: Pcp, No  Patient is from: Lives with friend.  Independently ambulates at baseline.  DOA: 08/13/2021 LOS: 1  Chief complaints:  Chief Complaint  Patient presents with   Delirium Tremens (DTS)    Presents with fine tremors    Chest Pain     Brief Narrative / Interim history: 45 year old [name] [name] speaking male with history of duodenal ulcer, alcohol and tobacco abuse presenting with dizziness and fall and admitted for alcohol withdrawal symptoms.  He also had reproducible chest pain.  He reports drinking about a cup of liquor daily.  CTH and CXR without significant finding.  Subjective: Seen and examined earlier this morning using his son and video interpreter with ID number [id number].  Patient complains chest pain in his left chest.  He states his pain is not inside.  Pain is reproducible to palpation.  Pain is also worse with deep breathing and certain movements.  Dizziness has improved.  He denies nausea, vomiting or abdominal pain.  He denies headache, acute vision change, focal weakness or numbness.  Objective: Vitals:   08/14/21 0900 08/14/21 1000 08/14/21 1022 08/14/21 1100  BP: 133/87 (!) 139/94 (!) 134/102 (!) 152/100  Pulse: 62 61 69 89  Resp: 11 11  17   Temp:      TempSrc:      SpO2: 98% 99%  100%  Weight:      Height:        Intake/Output Summary (Last 24 hours) at 08/14/2021 1219 Last data filed at 08/14/2021 1135 Gross per 24 hour  Intake 1050 ml  Output --  Net 1050 ml   Filed Weights   08/13/21 2025  Weight: 56 kg    Examination:  GENERAL: No apparent distress.  Nontoxic. HEENT: MMM.  Vision and hearing grossly intact.  NECK: Supple.  No apparent JVD.  RESP: On RA.  No IWOB.  Fair aeration bilaterally. CVS:  RRR. Heart sounds normal.  ABD/GI/GU: BS+. Abd soft, NTND.  MSK/EXT:  Moves extremities. No apparent deformity. No edema.  SKIN: no apparent skin lesion or wound NEURO: Awake, alert and  oriented appropriately.  No apparent focal neuro deficit. PSYCH: Somewhat anxious and jittery.  Procedures:  None  Microbiology summarized: COVID-19 and influenza PCR nonreactive.  Assessment & Plan: Alcohol abuse with withdrawal symptoms: Reports drinking about a cup of liquor a day.  Last drink was the day he presented to ED.  Somewhat anxious and jittery on exam.  -Added maintenance Librium taper -Continue CIWA with as needed Ativan -Change NS to NS-KCl and increase to 125 cc an hour. -Monitor electrolytes and replenish as appropriate -Continue vitamins, thiamine and folic acid -TOC consulted for counseling, PCP and living situation  Mild rhabdomyolysis-could be from fall or alcohol. -IV fluid as above -Recheck CK in the morning  Elevated LFT: Pattern consistent with alcohol and rhabdo. -Recheck in the morning  Hypokalemia: K2.8.  Mg within normal. -P.o. K-Dur 40 mill equivalent x2 -IVF with KCl as above -Recheck in the morning.  Tobacco use disorder: Reports smoking about a pack a day. -Cessation counseling -Nicotine patch  Dizziness/accidental fall: Likely from alcohol.  Neuro exam reassuring. -PT/OT -Fall precaution  Chest pain: Likely musculoskeletal.  Reproducible.  TTE basically normal. -As needed Tylenol  Acute metabolic encephalopathy: Reportedly restless and agitated while in ED. Likely restart symptoms.  This seems to have resolved. -Reorientation delirium precautions   Body mass index is 22.58 kg/m.  DVT prophylaxis:  enoxaparin (LOVENOX) injection 40 mg Start: 08/14/21 1800  Code Status: Full code Family Communication: Updated patient's son at bedside. Level of care: Progressive Status is: Observation  The patient will require care spanning > 2 midnights and should be moved to inpatient because: Persistent severe electrolyte disturbances, IV treatments appropriate due to intensity of illness or inability to take PO, and Inpatient level  of care appropriate due to severity of illness  Dispo: The patient is from: Home              Anticipated d/c is to: Home              Patient currently is not medically stable to d/c.   Difficult to place patient No       Consultants:  None   Sch Meds:  Scheduled Meds:  chlordiazePOXIDE  10 mg Oral QID   Followed by   Melene Muller ON 08/15/2021] chlordiazePOXIDE  10 mg Oral TID   Followed by   Melene Muller ON 08/16/2021] chlordiazePOXIDE  10 mg Oral BH-qamhs   Followed by   Melene Muller ON 08/17/2021] chlordiazePOXIDE  10 mg Oral Daily   enoxaparin (LOVENOX) injection  40 mg Subcutaneous Q24H   folic acid  1 mg Oral Daily   LORazepam  0-4 mg Intravenous Q6H   Followed by   Melene Muller ON 08/16/2021] LORazepam  0-4 mg Intravenous Q12H   multivitamin with minerals  1 tablet Oral Daily   potassium chloride  40 mEq Oral Q3H   thiamine  100 mg Oral Daily   Or   thiamine  100 mg Intravenous Daily   Continuous Infusions:  0.9 % NaCl with KCl 20 mEq / L     PRN Meds:.acetaminophen, loperamide, LORazepam **OR** LORazepam, ondansetron  Antimicrobials: Anti-infectives (From admission, onward)    None        I have personally reviewed the following labs and images: CBC: Recent Labs  Lab 08/13/21 2104 08/14/21 0755  WBC 6.0 4.9  NEUTROABS 4.1 3.0  HGB 14.2 14.9  HCT 41.3 43.9  MCV 96.5 96.7  PLT 127* 124*   BMP &GFR Recent Labs  Lab 08/13/21 2104 08/14/21 0755  NA 135 137  K 3.3* 2.8*  CL 98 100  CO2 28 27  GLUCOSE 97 93  BUN 7 7  CREATININE 0.67 0.45*  CALCIUM 9.3 9.3  MG  --  2.0  PHOS  --  3.9   Estimated Creatinine Clearance: 92.9 mL/min (A) (by C-G formula based on SCr of 0.45 mg/dL (L)). Liver & Pancreas: Recent Labs  Lab 08/13/21 2104 08/14/21 0755  AST 83* 66*  ALT 66* 58*  ALKPHOS 63 59  BILITOT 0.8 1.0  PROT 7.1 6.7  ALBUMIN 4.2 3.9   Recent Labs  Lab 08/13/21 2104  LIPASE 78*   No results for input(s): AMMONIA in the last 168 hours. Diabetic: No  results for input(s): HGBA1C in the last 72 hours. Recent Labs  Lab 08/14/21 0334  GLUCAP 100*   Cardiac Enzymes: Recent Labs  Lab 08/14/21 0755  CKTOTAL 409*   No results for input(s): PROBNP in the last 8760 hours. Coagulation Profile: No results for input(s): INR, PROTIME in the last 168 hours. Thyroid Function Tests: No results for input(s): TSH, T4TOTAL, FREET4, T3FREE, THYROIDAB in the last 72 hours. Lipid Profile: No results for input(s): CHOL, HDL, LDLCALC, TRIG, CHOLHDL, LDLDIRECT in the last 72 hours. Anemia Panel: No results for input(s): VITAMINB12, FOLATE, FERRITIN, TIBC, IRON, RETICCTPCT in the  last 72 hours. Urine analysis:    Component Value Date/Time   COLORURINE YELLOW 08/13/2021 2053   APPEARANCEUR CLEAR 08/13/2021 2053   LABSPEC 1.010 08/13/2021 2053   PHURINE 7.0 08/13/2021 2053   GLUCOSEU NEGATIVE 08/13/2021 2053   HGBUR NEGATIVE 08/13/2021 2053   BILIRUBINUR NEGATIVE 08/13/2021 2053   KETONESUR NEGATIVE 08/13/2021 2053   PROTEINUR NEGATIVE 08/13/2021 2053   UROBILINOGEN 0.2 08/15/2014 1226   NITRITE NEGATIVE 08/13/2021 2053   LEUKOCYTESUR NEGATIVE 08/13/2021 2053   Sepsis Labs: Invalid input(s): PROCALCITONIN, LACTICIDVEN  Microbiology: Recent Results (from the past 240 hour(s))  Resp Panel by RT-PCR (Flu A&B, Covid) Nasopharyngeal Swab     Status: None   Collection Time: 08/14/21  6:38 AM   Specimen: Nasopharyngeal Swab; Nasopharyngeal(NP) swabs in vial transport medium  Result Value Ref Range Status   SARS Coronavirus 2 by RT PCR NEGATIVE NEGATIVE Final    Comment: (NOTE) SARS-CoV-2 target nucleic acids are NOT DETECTED.  The SARS-CoV-2 RNA is generally detectable in upper respiratory specimens during the acute phase of infection. The lowest concentration of SARS-CoV-2 viral copies this assay can detect is 138 copies/mL. A negative result does not preclude SARS-Cov-2 infection and should not be used as the sole basis for treatment  or other patient management decisions. A negative result may occur with  improper specimen collection/handling, submission of specimen other than nasopharyngeal swab, presence of viral mutation(s) within the areas targeted by this assay, and inadequate number of viral copies(<138 copies/mL). A negative result must be combined with clinical observations, patient history, and epidemiological information. The expected result is Negative.  Fact Sheet for Patients:  BloggerCourse.com  Fact Sheet for Healthcare Providers:  SeriousBroker.it  This test is no t yet approved or cleared by the [place] FDA and  has been authorized for detection and/or diagnosis of SARS-CoV-2 by FDA under an Emergency Use Authorization (EUA). This EUA will remain  in effect (meaning this test can be used) for the duration of the COVID-19 declaration under Section 564(b)(1) of the Act, 21 U.S.C.section 360bbb-3(b)(1), unless the authorization is terminated  or revoked sooner.       Influenza A by PCR NEGATIVE NEGATIVE Final   Influenza B by PCR NEGATIVE NEGATIVE Final    Comment: (NOTE) The Xpert Xpress SARS-CoV-2/FLU/RSV plus assay is intended as an aid in the diagnosis of influenza from Nasopharyngeal swab specimens and should not be used as a sole basis for treatment. Nasal washings and aspirates are unacceptable for Xpert Xpress SARS-CoV-2/FLU/RSV testing.  Fact Sheet for Patients: BloggerCourse.com  Fact Sheet for Healthcare Providers: SeriousBroker.it  This test is not yet approved or cleared by the [place] FDA and has been authorized for detection and/or diagnosis of SARS-CoV-2 by FDA under an Emergency Use Authorization (EUA). This EUA will remain in effect (meaning this test can be used) for the duration of the COVID-19 declaration under Section 564(b)(1) of the Act, 21 U.S.C. section  360bbb-3(b)(1), unless the authorization is terminated or revoked.  Performed at [facility], [address], [place], [place] [address]     Radiology Studies: DG Chest 2 View  Result Date: 08/13/2021 CLINICAL DATA:  Chest pain the EXAM: CHEST - 2 VIEW COMPARISON:  None. FINDINGS: The heart size and mediastinal contours are within normal limits. Both lungs are clear. The visualized skeletal structures are unremarkable. IMPRESSION: No active cardiopulmonary disease. Electronically Signed   By: Darliss Cheney M.D.   On: 08/13/2021 21:30   CT Head Wo Contrast  Result Date: 08/13/2021 CLINICAL DATA:  Tremors, alcohol abuse EXAM: CT HEAD WITHOUT CONTRAST TECHNIQUE: Contiguous axial images were obtained from the base of the skull through the vertex without intravenous contrast. COMPARISON:  None FINDINGS: Brain: No acute infarct or hemorrhage. Lateral ventricles and midline structures are unremarkable. No acute extra-axial fluid collections. No mass effect. Vascular: No hyperdense vessel or unexpected calcification. Skull: Normal. Negative for fracture or focal lesion. Sinuses/Orbits: No acute finding. Other: None. IMPRESSION: 1. No acute intracranial process. Electronically Signed   By: Sharlet Salina M.D.   On: 08/13/2021 21:25   ECHOCARDIOGRAM COMPLETE  Result Date: 08/14/2021    ECHOCARDIOGRAM REPORT   Patient Name:   Edrei Arnell    Date of Exam: 08/14/2021 Medical Rec #:  [id number]  Height:       62.0 in Accession #:    [id number] Weight:       123.5 lb Date of Birth:  1976-10-20 BSA:          1.557 m Patient Age:    44 years   BP:           127/89 mmHg Patient Gender: M          HR:           63 bpm. Exam Location:  Inpatient Procedure: 2D Echo Indications:    chest pain  History:        Patient has no prior history of Echocardiogram examinations.                 Risk Factors:Hypertension.  Sonographer:    Delcie Roch RDCS Referring Phys: [id number] ARSHAD N KAKRAKANDY IMPRESSIONS  1. Left  ventricular ejection fraction, by estimation, is 60 to 65%. The left ventricle has normal function. The left ventricle has no regional wall motion abnormalities. Left ventricular diastolic parameters were normal.  2. Right ventricular systolic function is normal. The right ventricular size is normal. There is normal pulmonary artery systolic pressure. The estimated right ventricular systolic pressure is 18.2 mmHg.  3. The mitral valve is abnormal. Trivial mitral valve regurgitation.  4. The aortic valve is tricuspid. Aortic valve regurgitation is not visualized.  5. The inferior vena cava is normal in size with greater than 50% respiratory variability, suggesting right atrial pressure of 3 mmHg. Comparison(s): No prior Echocardiogram. FINDINGS  Left Ventricle: Left ventricular ejection fraction, by estimation, is 60 to 65%. The left ventricle has normal function. The left ventricle has no regional wall motion abnormalities. The left ventricular internal cavity size was normal in size. There is  no left ventricular hypertrophy. Left ventricular diastolic parameters were normal. Right Ventricle: The right ventricular size is normal. No increase in right ventricular wall thickness. Right ventricular systolic function is normal. There is normal pulmonary artery systolic pressure. The tricuspid regurgitant velocity is 1.95 m/s, and  with an assumed right atrial pressure of 3 mmHg, the estimated right ventricular systolic pressure is 18.2 mmHg. Left Atrium: Left atrial size was normal in size. Right Atrium: Right atrial size was normal in size. Pericardium: There is no evidence of pericardial effusion. Mitral Valve: The mitral valve is abnormal. There is mild thickening of the anterior and posterior mitral valve leaflet(s). Trivial mitral valve regurgitation. Tricuspid Valve: The tricuspid valve is grossly normal. Tricuspid valve regurgitation is trivial. Aortic Valve: The aortic valve is tricuspid. Aortic valve  regurgitation is not visualized. Pulmonic Valve: The pulmonic valve was grossly normal. Pulmonic valve regurgitation is not visualized. Aorta: The aortic root and  ascending aorta are structurally normal, with no evidence of dilitation. Venous: The inferior vena cava is normal in size with greater than 50% respiratory variability, suggesting right atrial pressure of 3 mmHg. IAS/Shunts: No atrial level shunt detected by color flow Doppler.  LEFT VENTRICLE PLAX 2D LVIDd:         3.90 cm  Diastology LVIDs:         2.60 cm  LV e' medial:    7.18 cm/s LV PW:         1.00 cm  LV E/e' medial:  7.3 LV IVS:        1.10 cm  LV e' lateral:   9.14 cm/s LVOT diam:     1.90 cm  LV E/e' lateral: 5.8 LV SV:         58 LV SV Index:   37 LVOT Area:     2.84 cm  RIGHT VENTRICLE             IVC RV S prime:     11.90 cm/s  IVC diam: 1.50 cm TAPSE (M-mode): 2.1 cm LEFT ATRIUM             Index       RIGHT ATRIUM           Index LA diam:        3.40 cm 2.18 cm/m  RA Area:     13.00 cm LA Vol (A2C):   47.1 ml 30.24 ml/m RA Volume:   30.50 ml  19.59 ml/m LA Vol (A4C):   36.6 ml 23.50 ml/m LA Biplane Vol: 41.6 ml 26.71 ml/m  AORTIC VALVE LVOT Vmax:   92.50 cm/s LVOT Vmean:  58.700 cm/s LVOT VTI:    0.203 m  AORTA Ao Root diam: 3.40 cm Ao Asc diam:  3.50 cm MITRAL VALVE               TRICUSPID VALVE MV Area (PHT): 3.27 cm    TR Peak grad:   15.2 mmHg MV Decel Time: 232 msec    TR Vmax:        195.00 cm/s MV E velocity: 52.70 cm/s MV A velocity: 55.30 cm/s  SHUNTS MV E/A ratio:  0.95        Systemic VTI:  0.20 m                            Systemic Diam: 1.90 cm Zoila Shutter MD Electronically signed by Zoila Shutter MD Signature Date/Time: 08/14/2021/12:11:32 PM    Final    US Abdomen Limited RUQ (LIVER/GB)  Result Date: 08/14/2021 CLINICAL DATA:  Elevated LFTs EXAM: ULTRASOUND ABDOMEN LIMITED RIGHT UPPER QUADRANT COMPARISON:  None. FINDINGS: Gallbladder: No gallstones or wall thickening visualized. No sonographic Murphy sign noted  by sonographer. Common bile duct: Diameter: 4 mm, normal Liver: No focal lesion identified. Within normal limits in parenchymal echogenicity. Portal vein is patent on color Doppler imaging with normal direction of blood flow towards the liver. Other: None. IMPRESSION: No abnormality identified. Electronically Signed   By: Guadlupe Spanish M.D.   On: 08/14/2021 07:21      Maleeha Halls T. Luane Rochon [facility]  If 7PM-7AM, please contact night-coverage [email or web address] 08/14/2021, 12:19 PM

## 2021-08-15 DIAGNOSIS — F10939 Alcohol use, unspecified with withdrawal, unspecified: Secondary | ICD-10-CM

## 2021-08-15 LAB — COMPREHENSIVE METABOLIC PANEL
ALT: 49 U/L — ABNORMAL HIGH (ref 0–44)
AST: 49 U/L — ABNORMAL HIGH (ref 15–41)
Albumin: 3.5 g/dL (ref 3.5–5.0)
Alkaline Phosphatase: 52 U/L (ref 38–126)
Anion gap: 10 (ref 5–15)
BUN: 12 mg/dL (ref 6–20)
CO2: 25 mmol/L (ref 22–32)
Calcium: 9.5 mg/dL (ref 8.9–10.3)
Chloride: 104 mmol/L (ref 98–111)
Creatinine, Ser: 0.61 mg/dL (ref 0.61–1.24)
GFR, Estimated: >60 mL/min (ref >60)
Glucose, Bld: 141 mg/dL — ABNORMAL HIGH (ref 70–99)
Potassium: 4.3 mmol/L (ref 3.5–5.1)
Sodium: 139 mmol/L (ref 135–145)
Total Bilirubin: 0.6 mg/dL (ref 0.3–1.2)
Total Protein: 6.1 g/dL — ABNORMAL LOW (ref 6.5–8.1)

## 2021-08-15 LAB — CK: Total CK: 210 U/L (ref 49–397)

## 2021-08-15 LAB — PHOSPHORUS: Phosphorus: 3.4 mg/dL (ref 2.5–4.6)

## 2021-08-15 LAB — MAGNESIUM: Magnesium: 1.9 mg/dL (ref 1.7–2.4)

## 2021-08-15 MED ORDER — LABETALOL HCL 5 MG/ML IV SOLN
10.0000 mg | INTRAVENOUS | Status: DC | PRN
  Administered 2021-08-15 – 2021-08-16 (×3): 10 mg via INTRAVENOUS
  Filled 2021-08-15 (×2): qty 4

## 2021-08-15 MED ORDER — CHLORDIAZEPOXIDE HCL 25 MG PO CAPS: 25.0000 mg | ORAL_CAPSULE | ORAL | Status: DC

## 2021-08-15 MED ORDER — CHLORDIAZEPOXIDE HCL 25 MG PO CAPS: 25.0000 mg | ORAL_CAPSULE | Freq: Every day | ORAL | Status: DC

## 2021-08-15 MED ORDER — CLONIDINE HCL 0.1 MG PO TABS
0.1000 mg | ORAL_TABLET | Freq: Two times a day (BID) | ORAL | Status: DC
  Administered 2021-08-15: 0.1 mg via ORAL
  Filled 2021-08-15: qty 1

## 2021-08-15 MED ORDER — CLONIDINE HCL 0.1 MG PO TABS
0.1000 mg | ORAL_TABLET | Freq: Three times a day (TID) | ORAL | Status: DC
  Administered 2021-08-15 – 2021-08-16 (×3): 0.1 mg via ORAL
  Filled 2021-08-15 (×3): qty 1

## 2021-08-15 MED ORDER — CHLORDIAZEPOXIDE HCL 25 MG PO CAPS
25.0000 mg | ORAL_CAPSULE | Freq: Three times a day (TID) | ORAL | Status: AC
  Administered 2021-08-15 – 2021-08-16 (×3): 25 mg via ORAL
  Filled 2021-08-15 (×3): qty 1

## 2021-08-15 NOTE — Progress Notes (Signed)
Continued hypertension, however improved from initial readings : Discussed with provider . Continue current treatment.

## 2021-08-15 NOTE — Progress Notes (Signed)
Admitted to unit, hypertensive 214/111, heart rate 65,     207/120,     208/113. CIWA score 11, given 2mg  ativan , contacted on call provider

## 2021-08-15 NOTE — Progress Notes (Signed)
Many conversations with Dr. Alanda Slim about this patient today as I had concerns about patient being discharged home with HTN issues. Per. Dr. Alanda Slim Clonidine was ordered and given further conversation was had approximately two hours later as patient blood pressure did not improve so IV fluids were discontinued. Again approximately two hours after fluids D/Ced patient blood pressure was still elevated and patient clonidine order was increased to help with hypertension. Furthermore, patient D/C was cancelled for today to get better control on blood pressure before sending him home.

## 2021-08-15 NOTE — Progress Notes (Signed)
PROGRESS NOTE  [name] [name] NOM:[id number] DOB: 09-17-1976   PCP: Pcp, No  Patient is from: Lives with friend.  Independently ambulates at baseline.  DOA: 08/13/2021 LOS: 1  Chief complaints:  Chief Complaint  Patient presents with   Delirium Tremens (DTS)    Presents with fine tremors    Chest Pain     Brief Narrative / Interim history: 45 year old [name] [name] speaking male with history of duodenal ulcer, alcohol and tobacco abuse presenting with dizziness and fall and admitted for alcohol withdrawal symptoms.  He also had reproducible chest pain.  He reports drinking about a cup of liquor daily.  CTH and CXR without significant finding.  He was admitted for alcohol withdrawal.  Started on CIWA with as needed Ativan.  Added Librium taper the next day.  Had markedly elevated BP overnight.  Increased Librium taper and added clonidine.    Subjective: Seen and examined earlier this morning with the help of phone interpreter with ID number [id number].  Had markedly elevated blood pressure with SBP to 200s.  Blood pressure improved earlier this morning but continued to rise again.  He complains pain in both calves and the whole back.  He denies chest pain, dyspnea, GI or UTI symptoms.  He denies audiovisual hallucination.  Objective: Vitals:   08/15/21 1200 08/15/21 1300 08/15/21 1428 08/15/21 1500  BP: (!) 150/103 (!) 164/99 (!) 162/103 (!) 156/102  Pulse: 69 64 65 64  Resp: 14 15 13 13   Temp:      TempSrc:      SpO2: 98% 99% 99% 98%  Weight:      Height:        Intake/Output Summary (Last 24 hours) at 08/15/2021 1555 Last data filed at 08/15/2021 1500 Gross per 24 hour  Intake 1906.04 ml  Output 3800 ml  Net -1893.96 ml   Filed Weights   08/13/21 2025 08/15/21 0200  Weight: 56 kg 50.1 kg    Examination:  GENERAL: No apparent distress.  Nontoxic. HEENT: MMM.  Vision and hearing grossly intact.  NECK: Supple.  No apparent JVD.  RESP: 98% on RA.  No IWOB.  Fair aeration  bilaterally. CVS:  RRR. Heart sounds normal.  ABD/GI/GU: BS+. Abd soft, NTND.  MSK/EXT:  Moves extremities. No apparent deformity. No edema, focal tenderness or swelling.  SKIN: no apparent skin lesion or wound NEURO: Sleepy but wakes to voice easily.  Oriented appropriately.  No apparent focal neuro deficit but somewhat jittery when he got up. PSYCH: Calm.  Flat affect?  Procedures:  None  Microbiology summarized: COVID-19 and influenza PCR nonreactive.  Assessment & Plan: Alcohol abuse with withdrawal symptoms: Reports drinking about a cup of liquor a day.  Last drink was the day he presented to ED.  Somewhat anxious and jittery on exam.  Markedly elevated blood pressure -Increase maintenance Librium taper to 25 mg -Add clonidine 0.1 mg 3 times daily -Continue CIWA with as needed Ativan -Discontinue IV fluid -Monitor electrolytes and replenish as appropriate -Continue vitamins, thiamine and folic acid -TOC consulted for counseling, PCP and living situation  Mild rhabdomyolysis-could be from fall or alcohol.  CK normalized. -Discontinue IV fluid  Elevated LFT: Pattern consistent with alcohol and rhabdo.  Improved. -Recheck in the morning  Hypokalemia: Resolved.  Hyponatremia: Likely beer potomania from alcohol.  Stable.  Tobacco use disorder: Reports smoking about a pack a day. -Cessation counseling -Nicotine patch  Dizziness/accidental fall: Likely from alcohol.  Neuro exam reassuring. -PT/OT -Fall precaution  Chest pain: Likely  musculoskeletal.  Reproducible.  TTE basically normal. -As needed Tylenol  Acute metabolic encephalopathy: "Restless and agitated while in ED".  Likely withdrawal.  Improved. -Reorientation delirium precautions  Language barrier: Patient speaks [name] [name].  -Used phone interpreter using language iPad.  Body mass index is 20.21 kg/m.         DVT prophylaxis:  enoxaparin (LOVENOX) injection 40 mg Start: 08/14/21 1800  Code Status: Full  code Family Communication: Updated patient's son at bedside on 9/30. Level of care: Progressive Status is: Inpatient  Remains inpatient appropriate because:Hemodynamically unstable, Altered mental status, IV treatments appropriate due to intensity of illness or inability to take PO, and Inpatient level of care appropriate due to severity of illness  Dispo: The patient is from: Home              Anticipated d/c is to: Home              Patient currently is not medically stable to d/c.   Difficult to place patient No            Consultants:  None   Sch Meds:  Scheduled Meds:  chlordiazePOXIDE  25 mg Oral TID   Followed by   Melene Muller ON 08/16/2021] chlordiazePOXIDE  25 mg Oral BH-qamhs   Followed by   Melene Muller ON 08/17/2021] chlordiazePOXIDE  25 mg Oral Daily   cloNIDine  0.1 mg Oral TID   enoxaparin (LOVENOX) injection  40 mg Subcutaneous Q24H   folic acid  1 mg Oral Daily   multivitamin with minerals  1 tablet Oral Daily   thiamine  100 mg Oral Daily   Or   thiamine  100 mg Intravenous Daily   Continuous Infusions:   PRN Meds:.acetaminophen, labetalol, loperamide, LORazepam **OR** LORazepam, ondansetron  Antimicrobials: Anti-infectives (From admission, onward)    None        I have personally reviewed the following labs and images: CBC: Recent Labs  Lab 08/13/21 2104 08/14/21 0755  WBC 6.0 4.9  NEUTROABS 4.1 3.0  HGB 14.2 14.9  HCT 41.3 43.9  MCV 96.5 96.7  PLT 127* 124*   BMP &GFR Recent Labs  Lab 08/13/21 2104 08/14/21 0755 08/15/21 0210  NA 135 137 139  K 3.3* 2.8* 4.3  CL 98 100 104  CO2 28 27 25   GLUCOSE 97 93 141*  BUN 7 7 12   CREATININE 0.67 0.45* 0.61  CALCIUM 9.3 9.3 9.5  MG  --  2.0 1.9  PHOS  --  3.9 3.4   Estimated Creatinine Clearance: 85.2 mL/min (by C-G formula based on SCr of 0.61 mg/dL). Liver & Pancreas: Recent Labs  Lab 08/13/21 2104 08/14/21 0755 08/15/21 0210  AST 83* 66* 49*  ALT 66* 58* 49*  ALKPHOS 63 59 52   BILITOT 0.8 1.0 0.6  PROT 7.1 6.7 6.1*  ALBUMIN 4.2 3.9 3.5   Recent Labs  Lab 08/13/21 2104  LIPASE 78*   No results for input(s): AMMONIA in the last 168 hours. Diabetic: No results for input(s): HGBA1C in the last 72 hours. Recent Labs  Lab 08/14/21 0334 08/14/21 2240  GLUCAP 100* 146*   Cardiac Enzymes: Recent Labs  Lab 08/14/21 0755 08/15/21 0210  CKTOTAL 409* 210   No results for input(s): PROBNP in the last 8760 hours. Coagulation Profile: No results for input(s): INR, PROTIME in the last 168 hours. Thyroid Function Tests: No results for input(s): TSH, T4TOTAL, FREET4, T3FREE, THYROIDAB in the last 72 hours. Lipid Profile: No results for input(s):  CHOL, HDL, LDLCALC, TRIG, CHOLHDL, LDLDIRECT in the last 72 hours. Anemia Panel: No results for input(s): VITAMINB12, FOLATE, FERRITIN, TIBC, IRON, RETICCTPCT in the last 72 hours. Urine analysis:    Component Value Date/Time   COLORURINE YELLOW 08/13/2021 2053   APPEARANCEUR CLEAR 08/13/2021 2053   LABSPEC 1.010 08/13/2021 2053   PHURINE 7.0 08/13/2021 2053   GLUCOSEU NEGATIVE 08/13/2021 2053   HGBUR NEGATIVE 08/13/2021 2053   BILIRUBINUR NEGATIVE 08/13/2021 2053   KETONESUR NEGATIVE 08/13/2021 2053   PROTEINUR NEGATIVE 08/13/2021 2053   UROBILINOGEN 0.2 08/15/2014 1226   NITRITE NEGATIVE 08/13/2021 2053   LEUKOCYTESUR NEGATIVE 08/13/2021 2053   Sepsis Labs: Invalid input(s): PROCALCITONIN, LACTICIDVEN  Microbiology: Recent Results (from the past 240 hour(s))  Resp Panel by RT-PCR (Flu A&B, Covid) Nasopharyngeal Swab     Status: None   Collection Time: 08/14/21  6:38 AM   Specimen: Nasopharyngeal Swab; Nasopharyngeal(NP) swabs in vial transport medium  Result Value Ref Range Status   SARS Coronavirus 2 by RT PCR NEGATIVE NEGATIVE Final    Comment: (NOTE) SARS-CoV-2 target nucleic acids are NOT DETECTED.  The SARS-CoV-2 RNA is generally detectable in upper respiratory specimens during the acute phase  of infection. The lowest concentration of SARS-CoV-2 viral copies this assay can detect is 138 copies/mL. A negative result does not preclude SARS-Cov-2 infection and should not be used as the sole basis for treatment or other patient management decisions. A negative result may occur with  improper specimen collection/handling, submission of specimen other than nasopharyngeal swab, presence of viral mutation(s) within the areas targeted by this assay, and inadequate number of viral copies(<138 copies/mL). A negative result must be combined with clinical observations, patient history, and epidemiological information. The expected result is Negative.  Fact Sheet for Patients:  BloggerCourse.com  Fact Sheet for Healthcare Providers:  SeriousBroker.it  This test is no t yet approved or cleared by the [place] FDA and  has been authorized for detection and/or diagnosis of SARS-CoV-2 by FDA under an Emergency Use Authorization (EUA). This EUA will remain  in effect (meaning this test can be used) for the duration of the COVID-19 declaration under Section 564(b)(1) of the Act, 21 U.S.C.section 360bbb-3(b)(1), unless the authorization is terminated  or revoked sooner.       Influenza A by PCR NEGATIVE NEGATIVE Final   Influenza B by PCR NEGATIVE NEGATIVE Final    Comment: (NOTE) The Xpert Xpress SARS-CoV-2/FLU/RSV plus assay is intended as an aid in the diagnosis of influenza from Nasopharyngeal swab specimens and should not be used as a sole basis for treatment. Nasal washings and aspirates are unacceptable for Xpert Xpress SARS-CoV-2/FLU/RSV testing.  Fact Sheet for Patients: BloggerCourse.com  Fact Sheet for Healthcare Providers: SeriousBroker.it  This test is not yet approved or cleared by the [place] FDA and has been authorized for detection and/or diagnosis of  SARS-CoV-2 by FDA under an Emergency Use Authorization (EUA). This EUA will remain in effect (meaning this test can be used) for the duration of the COVID-19 declaration under Section 564(b)(1) of the Act, 21 U.S.C. section 360bbb-3(b)(1), unless the authorization is terminated or revoked.  Performed at [facility], [address] [place], [place] [address]     Radiology Studies: No results found.    Catera Hankins T. Yentl Verge [facility]  If 7PM-7AM, please contact night-coverage [email or web address] 08/15/2021, 3:55 PM

## 2021-08-15 NOTE — Progress Notes (Signed)
[facility] night shift PCU coverage note.  The nursing staff reported that the patient's most recent blood pressure measurement was 227/120 mmHg.  He was on hydralazine as needed but this was discontinued.  Labetalol 10 mg IVP every 2 hours as needed ordered.  Sanda Klein, MD.

## 2021-08-15 NOTE — ED Notes (Signed)
Attempted report 

## 2021-08-16 DIAGNOSIS — E871 Hypo-osmolality and hyponatremia: Secondary | ICD-10-CM

## 2021-08-16 DIAGNOSIS — I10 Essential (primary) hypertension: Secondary | ICD-10-CM

## 2021-08-16 LAB — COMPREHENSIVE METABOLIC PANEL
ALT: 45 U/L — ABNORMAL HIGH (ref 0–44)
AST: 39 U/L (ref 15–41)
Albumin: 3.7 g/dL (ref 3.5–5.0)
Alkaline Phosphatase: 56 U/L (ref 38–126)
Anion gap: 6 (ref 5–15)
BUN: 10 mg/dL (ref 6–20)
CO2: 28 mmol/L (ref 22–32)
Calcium: 9.4 mg/dL (ref 8.9–10.3)
Chloride: 102 mmol/L (ref 98–111)
Creatinine, Ser: 0.68 mg/dL (ref 0.61–1.24)
GFR, Estimated: >60 mL/min (ref >60)
Glucose, Bld: 114 mg/dL — ABNORMAL HIGH (ref 70–99)
Potassium: 4.6 mmol/L (ref 3.5–5.1)
Sodium: 136 mmol/L (ref 135–145)
Total Bilirubin: 0.6 mg/dL (ref 0.3–1.2)
Total Protein: 6.7 g/dL (ref 6.5–8.1)

## 2021-08-16 LAB — MAGNESIUM: Magnesium: 2.1 mg/dL (ref 1.7–2.4)

## 2021-08-16 LAB — PHOSPHORUS: Phosphorus: 3.8 mg/dL (ref 2.5–4.6)

## 2021-08-16 MED ORDER — FOLIC ACID 1 MG PO TABS: 1.0000 mg | ORAL_TABLET | Freq: Every day | ORAL | Status: DC

## 2021-08-16 MED ORDER — CHLORDIAZEPOXIDE HCL 10 MG PO CAPS: ORAL_CAPSULE | ORAL | 0 refills | Status: DC

## 2021-08-16 MED ORDER — HYDRALAZINE HCL 20 MG/ML IJ SOLN
10.0000 mg | Freq: Once | INTRAMUSCULAR | Status: AC
  Administered 2021-08-16: 10 mg via INTRAVENOUS
  Filled 2021-08-16: qty 1

## 2021-08-16 MED ORDER — ADULT MULTIVITAMIN W/MINERALS CH: 1.0000 | ORAL_TABLET | Freq: Every day | ORAL | Status: AC

## 2021-08-16 MED ORDER — AMLODIPINE BESYLATE 10 MG PO TABS
10.0000 mg | ORAL_TABLET | Freq: Every day | ORAL | Status: DC
  Administered 2021-08-16: 10 mg via ORAL
  Filled 2021-08-16: qty 1

## 2021-08-16 MED ORDER — CLONIDINE HCL 0.1 MG PO TABS: ORAL_TABLET | ORAL | 0 refills | Status: AC

## 2021-08-16 MED ORDER — AMLODIPINE BESYLATE 10 MG PO TABS: 10.0000 mg | ORAL_TABLET | Freq: Every day | ORAL | 1 refills | Status: DC

## 2021-08-16 MED ORDER — AMLODIPINE BESYLATE 10 MG PO TABS: 10.0000 mg | ORAL_TABLET | Freq: Every day | ORAL | 1 refills | Status: AC

## 2021-08-16 MED ORDER — CLONIDINE HCL 0.1 MG PO TABS: ORAL_TABLET | ORAL | 0 refills | Status: DC

## 2021-08-16 MED ORDER — CHLORDIAZEPOXIDE HCL 10 MG PO CAPS: ORAL_CAPSULE | ORAL | 0 refills | Status: AC

## 2021-08-16 MED ORDER — FOLIC ACID 1 MG PO TABS: 1.0000 mg | ORAL_TABLET | Freq: Every day | ORAL | Status: AC

## 2021-08-16 NOTE — Discharge Summary (Signed)
Physician Discharge Summary  Tell Orbach SWF:[id number] DOB: 12/29/1975 DOA: 08/13/2021  PCP: Pcp, No.  Provided with list of PCPs by [facility] on discharge  Admit date: 08/13/2021 Discharge date: 08/16/2021 Admitted From: Home Disposition: Home Recommendations for Outpatient Follow-up:  Follow ups as below. Please obtain CBC/CMP/Mag at follow up Encourage tobacco and alcohol cessation Recheck blood pressure and adjust antihypertensive meds as appropriate Please follow up on the following pending results: None  Home Health: None Equipment/Devices: None Discharge Condition: Stable CODE STATUS: Full code  Hospital Course: 45 year old [name] [name] with history of duodenal ulcer, alcohol and tobacco abuse presenting with dizziness and fall and admitted for alcohol withdrawal symptoms.  He also had reproducible chest pain.  He reports drinking about a cup of liquor daily.  CTH and CXR without significant finding.   He was admitted for alcohol withdrawal.  Started on CIWA with as needed Ativan.  Added Librium taper the next day.  Had markedly elevated BP overnight.  Increased Librium taper and added clonidine and amlodipine.  On the day of discharge, his withdrawal symptoms and blood pressure improved.  He is discharged on Librium taper, clonidine taper and amlodipine as below.  Encouraged to stay away from alcohol and quit smoking cigarettes.  Provided with resources including list of PCPs.  He will be going home with his son and other family members.  See individual problem list below for more on hospital course.  Discharge Diagnoses:  Alcohol abuse with withdrawal symptoms: Reports drinking about a cup of liquor a day.  Withdrawal symptoms and blood pressure improved. -Discharged on Librium taper, clonidine taper and multivitamins. -Encouraged on alcohol cessation and provided with resources.   Mild rhabdomyolysis-could be from fall or alcohol.  CK normalized.   Elevated LFT: Pattern  consistent with alcohol and rhabdo.  Improved. -Recheck CMP at follow-up   Hypokalemia/hyponatremia: Likely from alcohol.  Resolved.   Tobacco use disorder: Reports smoking about a pack a day. -Encourage cessation and provided with resources.   Dizziness/accidental fall: Likely from alcohol.  Neuro exam reassuring.  Resolved. -No need identified by therapy   Chest pain: Likely musculoskeletal.  Reproducible.  TTE basically normal. -As needed Tylenol   Acute metabolic encephalopathy: "Restless and agitated while in ED".  Likely withdrawal.  Resolved.   Language barrier: Patient speaks [name] Braun.  -Used phone interpreter using language iPad.  Body mass index is 20.21 kg/m.           Discharge Exam: Vitals:   08/16/21 0800 08/16/21 1000 08/16/21 1100 08/16/21 1200  BP: (!) 178/107 138/82 (!) 126/54 137/65  Pulse:  63 64 (!) 58  Temp:      Resp:  13 18 15   Height:      Weight:      SpO2:  100% 99% 98%  TempSrc:      BMI (Calculated):         GENERAL: No apparent distress.  Nontoxic. HEENT: MMM.  Vision and hearing grossly intact.  NECK: Supple.  No apparent JVD.  RESP: 98% on RA.  No IWOB.  Fair aeration bilaterally. CVS:  RRR. Heart sounds normal.  ABD/GI/GU: Bowel sounds present. Soft. Non tender.  MSK/EXT:  Moves extremities. No apparent deformity. No edema.  SKIN: no apparent skin lesion or wound NEURO: Awake and alert.  Oriented appropriately.  No apparent focal neuro deficit. PSYCH: Calm. Normal affect.   Discharge Instructions  Discharge Instructions     Call MD for:  difficulty breathing, headache or visual  disturbances   Complete by: As directed    Call MD for:  extreme fatigue   Complete by: As directed    Call MD for:  persistant dizziness or light-headedness   Complete by: As directed    Call MD for:  persistant nausea and vomiting   Complete by: As directed    Call MD for:  severe uncontrolled pain   Complete by: As directed    Diet - low sodium  heart healthy   Complete by: As directed    Discharge instructions   Complete by: As directed    It has been a pleasure taking care of you!  You were hospitalized due to alcohol withdrawal.  You have been treated with medications and your symptoms improved.  We have also started you on blood pressure medication.  It is very important that you quit drinking alcohol, quit smoking cigarettes and take your medications as prescribed. Please establish care with a primary care doctor as soon as possible.  It is important that you quit smoking cigarettes.  You may use nicotine patch to help you quit smoking.  Nicotine patch is available over-the-counter.  You may also discuss other options to help you quit smoking with your primary care doctor. You can also talk to professional counselors at 1-800-QUIT-NOW 3213005233) for free smoking cessation counseling.     Take care,   Increase activity slowly   Complete by: As directed       Allergies as of 08/16/2021       Reactions   Aspirin    Perforated duodenal ulcer   Nsaids    Perforated duodenal ulcer        Medication List     TAKE these medications    amLODipine 10 MG tablet Commonly known as: NORVASC Take 1 tablet (10 mg total) by mouth daily. Start taking on: August 17, 2021   chlordiazePOXIDE 10 MG capsule Commonly known as: LIBRIUM Take 1 capsule (10 mg total) by mouth 3 (three) times daily for 2 days, THEN 1 capsule (10 mg total) in the morning and at bedtime for 2 days, THEN 1 capsule (10 mg total) daily for 2 days. Start taking on: August 16, 2021   cloNIDine 0.1 MG tablet Commonly known as: CATAPRES Take 1 tablet (0.1 mg total) by mouth 3 (three) times daily for 2 days, THEN 1 tablet (0.1 mg total) 2 (two) times daily for 2 days, THEN 1 tablet (0.1 mg total) daily for 2 days. Start taking on: August 16, 2021 What changed: See the new instructions.   folic acid 1 MG tablet Commonly known as: FOLVITE Take 1 tablet  (1 mg total) by mouth daily. Start taking on: August 17, 2021   HYDROcodone-acetaminophen 5-325 MG tablet Commonly known as: NORCO/VICODIN Take 1-2 tablets by mouth every 6 (six) hours as needed for moderate pain or severe pain.   multivitamin with minerals Tabs tablet Take 1 tablet by mouth daily. Start taking on: August 17, 2021   omeprazole 40 MG capsule Commonly known as: PriLOSEC Take 1 capsule (40 mg total) by mouth 2 (two) times daily.        Consultations: None  Procedures/Studies:   DG Chest 2 View  Result Date: 08/13/2021 CLINICAL DATA:  Chest pain the EXAM: CHEST - 2 VIEW COMPARISON:  None. FINDINGS: The heart size and mediastinal contours are within normal limits. Both lungs are clear. The visualized skeletal structures are unremarkable. IMPRESSION: No active cardiopulmonary disease. Electronically Signed   By:  Darliss Cheney M.D.   On: 08/13/2021 21:30   CT Head Wo Contrast  Result Date: 08/13/2021 CLINICAL DATA:  Tremors, alcohol abuse EXAM: CT HEAD WITHOUT CONTRAST TECHNIQUE: Contiguous axial images were obtained from the base of the skull through the vertex without intravenous contrast. COMPARISON:  None FINDINGS: Brain: No acute infarct or hemorrhage. Lateral ventricles and midline structures are unremarkable. No acute extra-axial fluid collections. No mass effect. Vascular: No hyperdense vessel or unexpected calcification. Skull: Normal. Negative for fracture or focal lesion. Sinuses/Orbits: No acute finding. Other: None. IMPRESSION: 1. No acute intracranial process. Electronically Signed   By: Sharlet Salina M.D.   On: 08/13/2021 21:25   ECHOCARDIOGRAM COMPLETE  Result Date: 08/14/2021    ECHOCARDIOGRAM REPORT   Patient Name:   Khiry Sledge    Date of Exam: 08/14/2021 Medical Rec #:  [id number]  Height:       62.0 in Accession #:    [id number] Weight:       123.5 lb Date of Birth:  09/08/1976 BSA:          1.557 m Patient Age:    44 years   BP:           127/89 mmHg  Patient Gender: M          HR:           63 bpm. Exam Location:  Inpatient Procedure: 2D Echo Indications:    chest pain  History:        Patient has no prior history of Echocardiogram examinations.                 Risk Factors:Hypertension.  Sonographer:    Delcie Roch RDCS Referring Phys: [id number] ARSHAD N KAKRAKANDY IMPRESSIONS  1. Left ventricular ejection fraction, by estimation, is 60 to 65%. The left ventricle has normal function. The left ventricle has no regional wall motion abnormalities. Left ventricular diastolic parameters were normal.  2. Right ventricular systolic function is normal. The right ventricular size is normal. There is normal pulmonary artery systolic pressure. The estimated right ventricular systolic pressure is 18.2 mmHg.  3. The mitral valve is abnormal. Trivial mitral valve regurgitation.  4. The aortic valve is tricuspid. Aortic valve regurgitation is not visualized.  5. The inferior vena cava is normal in size with greater than 50% respiratory variability, suggesting right atrial pressure of 3 mmHg. Comparison(s): No prior Echocardiogram. FINDINGS  Left Ventricle: Left ventricular ejection fraction, by estimation, is 60 to 65%. The left ventricle has normal function. The left ventricle has no regional wall motion abnormalities. The left ventricular internal cavity size was normal in size. There is  no left ventricular hypertrophy. Left ventricular diastolic parameters were normal. Right Ventricle: The right ventricular size is normal. No increase in right ventricular wall thickness. Right ventricular systolic function is normal. There is normal pulmonary artery systolic pressure. The tricuspid regurgitant velocity is 1.95 m/s, and  with an assumed right atrial pressure of 3 mmHg, the estimated right ventricular systolic pressure is 18.2 mmHg. Left Atrium: Left atrial size was normal in size. Right Atrium: Right atrial size was normal in size. Pericardium: There is no evidence of  pericardial effusion. Mitral Valve: The mitral valve is abnormal. There is mild thickening of the anterior and posterior mitral valve leaflet(s). Trivial mitral valve regurgitation. Tricuspid Valve: The tricuspid valve is grossly normal. Tricuspid valve regurgitation is trivial. Aortic Valve: The aortic valve is tricuspid. Aortic valve regurgitation is not visualized. Pulmonic Valve: The  pulmonic valve was grossly normal. Pulmonic valve regurgitation is not visualized. Aorta: The aortic root and ascending aorta are structurally normal, with no evidence of dilitation. Venous: The inferior vena cava is normal in size with greater than 50% respiratory variability, suggesting right atrial pressure of 3 mmHg. IAS/Shunts: No atrial level shunt detected by color flow Doppler.  LEFT VENTRICLE PLAX 2D LVIDd:         3.90 cm  Diastology LVIDs:         2.60 cm  LV e' medial:    7.18 cm/s LV PW:         1.00 cm  LV E/e' medial:  7.3 LV IVS:        1.10 cm  LV e' lateral:   9.14 cm/s LVOT diam:     1.90 cm  LV E/e' lateral: 5.8 LV SV:         58 LV SV Index:   37 LVOT Area:     2.84 cm  RIGHT VENTRICLE             IVC RV S prime:     11.90 cm/s  IVC diam: 1.50 cm TAPSE (M-mode): 2.1 cm LEFT ATRIUM             Index       RIGHT ATRIUM           Index LA diam:        3.40 cm 2.18 cm/m  RA Area:     13.00 cm LA Vol (A2C):   47.1 ml 30.24 ml/m RA Volume:   30.50 ml  19.59 ml/m LA Vol (A4C):   36.6 ml 23.50 ml/m LA Biplane Vol: 41.6 ml 26.71 ml/m  AORTIC VALVE LVOT Vmax:   92.50 cm/s LVOT Vmean:  58.700 cm/s LVOT VTI:    0.203 m  AORTA Ao Root diam: 3.40 cm Ao Asc diam:  3.50 cm MITRAL VALVE               TRICUSPID VALVE MV Area (PHT): 3.27 cm    TR Peak grad:   15.2 mmHg MV Decel Time: 232 msec    TR Vmax:        195.00 cm/s MV E velocity: 52.70 cm/s MV A velocity: 55.30 cm/s  SHUNTS MV E/A ratio:  0.95        Systemic VTI:  0.20 m                            Systemic Diam: 1.90 cm Zoila Shutter MD Electronically signed by  Zoila Shutter MD Signature Date/Time: 08/14/2021/12:11:32 PM    Final    US Abdomen Limited RUQ (LIVER/GB)  Result Date: 08/14/2021 CLINICAL DATA:  Elevated LFTs EXAM: ULTRASOUND ABDOMEN LIMITED RIGHT UPPER QUADRANT COMPARISON:  None. FINDINGS: Gallbladder: No gallstones or wall thickening visualized. No sonographic Murphy sign noted by sonographer. Common bile duct: Diameter: 4 mm, normal Liver: No focal lesion identified. Within normal limits in parenchymal echogenicity. Portal vein is patent on color Doppler imaging with normal direction of blood flow towards the liver. Other: None. IMPRESSION: No abnormality identified. Electronically Signed   By: Guadlupe Spanish M.D.   On: 08/14/2021 07:21       The results of significant diagnostics from this hospitalization (including imaging, microbiology, ancillary and laboratory) are listed below for reference.     Microbiology: Recent Results (from the past 240 hour(s))  Resp Panel by RT-PCR (Flu A&B,  Covid) Nasopharyngeal Swab     Status: None   Collection Time: 08/14/21  6:38 AM   Specimen: Nasopharyngeal Swab; Nasopharyngeal(NP) swabs in vial transport medium  Result Value Ref Range Status   SARS Coronavirus 2 by RT PCR NEGATIVE NEGATIVE Final    Comment: (NOTE) SARS-CoV-2 target nucleic acids are NOT DETECTED.  The SARS-CoV-2 RNA is generally detectable in upper respiratory specimens during the acute phase of infection. The lowest concentration of SARS-CoV-2 viral copies this assay can detect is 138 copies/mL. A negative result does not preclude SARS-Cov-2 infection and should not be used as the sole basis for treatment or other patient management decisions. A negative result may occur with  improper specimen collection/handling, submission of specimen other than nasopharyngeal swab, presence of viral mutation(s) within the areas targeted by this assay, and inadequate number of viral copies(<138 copies/mL). A negative result must be  combined with clinical observations, patient history, and epidemiological information. The expected result is Negative.  Fact Sheet for Patients:  BloggerCourse.com  Fact Sheet for Healthcare Providers:  SeriousBroker.it  This test is no t yet approved or cleared by the [place] FDA and  has been authorized for detection and/or diagnosis of SARS-CoV-2 by FDA under an Emergency Use Authorization (EUA). This EUA will remain  in effect (meaning this test can be used) for the duration of the COVID-19 declaration under Section 564(b)(1) of the Act, 21 U.S.C.section 360bbb-3(b)(1), unless the authorization is terminated  or revoked sooner.       Influenza A by PCR NEGATIVE NEGATIVE Final   Influenza B by PCR NEGATIVE NEGATIVE Final    Comment: (NOTE) The Xpert Xpress SARS-CoV-2/FLU/RSV plus assay is intended as an aid in the diagnosis of influenza from Nasopharyngeal swab specimens and should not be used as a sole basis for treatment. Nasal washings and aspirates are unacceptable for Xpert Xpress SARS-CoV-2/FLU/RSV testing.  Fact Sheet for Patients: BloggerCourse.com  Fact Sheet for Healthcare Providers: SeriousBroker.it  This test is not yet approved or cleared by the [place] FDA and has been authorized for detection and/or diagnosis of SARS-CoV-2 by FDA under an Emergency Use Authorization (EUA). This EUA will remain in effect (meaning this test can be used) for the duration of the COVID-19 declaration under Section 564(b)(1) of the Act, 21 U.S.C. section 360bbb-3(b)(1), unless the authorization is terminated or revoked.  Performed at [facility], [address] [place], [place] [address]      Labs:  CBC: Recent Labs  Lab 08/13/21 2104 08/14/21 0755  WBC 6.0 4.9  NEUTROABS 4.1 3.0  HGB 14.2 14.9  HCT 41.3 43.9  MCV 96.5 96.7  PLT 127* 124*    BMP &GFR Recent Labs  Lab 08/13/21 2104 08/14/21 0755 08/15/21 0210 08/16/21 0158  NA 135 137 139 136  K 3.3* 2.8* 4.3 4.6  CL 98 100 104 102  CO2 28 27 25 28   GLUCOSE 97 93 141* 114*  BUN 7 7 12 10   CREATININE 0.67 0.45* 0.61 0.68  CALCIUM 9.3 9.3 9.5 9.4  MG  --  2.0 1.9 2.1  PHOS  --  3.9 3.4 3.8   Estimated Creatinine Clearance: 85.2 mL/min (by C-G formula based on SCr of 0.68 mg/dL). Liver & Pancreas: Recent Labs  Lab 08/13/21 2104 08/14/21 0755 08/15/21 0210 08/16/21 0158  AST 83* 66* 49* 39  ALT 66* 58* 49* 45*  ALKPHOS 63 59 52 56  BILITOT 0.8 1.0 0.6 0.6  PROT 7.1 6.7 6.1* 6.7  ALBUMIN 4.2 3.9 3.5 3.7   Recent Labs  Lab 08/13/21 2104  LIPASE 78*   No results for input(s): AMMONIA in the last 168 hours. Diabetic: No results for input(s): HGBA1C in the last 72 hours. Recent Labs  Lab 08/14/21 0334 08/14/21 2240  GLUCAP 100* 146*   Cardiac Enzymes: Recent Labs  Lab 08/14/21 0755 08/15/21 0210  CKTOTAL 409* 210   No results for input(s): PROBNP in the last 8760 hours. Coagulation Profile: No results for input(s): INR, PROTIME in the last 168 hours. Thyroid Function Tests: No results for input(s): TSH, T4TOTAL, FREET4, T3FREE, THYROIDAB in the last 72 hours. Lipid Profile: No results for input(s): CHOL, HDL, LDLCALC, TRIG, CHOLHDL, LDLDIRECT in the last 72 hours. Anemia Panel: No results for input(s): VITAMINB12, FOLATE, FERRITIN, TIBC, IRON, RETICCTPCT in the last 72 hours. Urine analysis:    Component Value Date/Time   COLORURINE YELLOW 08/13/2021 2053   APPEARANCEUR CLEAR 08/13/2021 2053   LABSPEC 1.010 08/13/2021 2053   PHURINE 7.0 08/13/2021 2053   GLUCOSEU NEGATIVE 08/13/2021 2053   HGBUR NEGATIVE 08/13/2021 2053   BILIRUBINUR NEGATIVE 08/13/2021 2053   KETONESUR NEGATIVE 08/13/2021 2053   PROTEINUR NEGATIVE 08/13/2021 2053   UROBILINOGEN 0.2 08/15/2014 1226   NITRITE NEGATIVE 08/13/2021 2053   LEUKOCYTESUR NEGATIVE 08/13/2021  2053   Sepsis Labs: Invalid input(s): PROCALCITONIN, LACTICIDVEN   Time coordinating discharge: 45 minutes  SIGNED:  Almon Hercules, MD  [facility] 08/16/2021, 2:42 PM

## 2021-08-16 NOTE — TOC Progression Note (Signed)
Transition of Care [facility]) - Progression Note    Patient Details  Name: [name] [name] MRN: [id number] Date of Birth: 1976-10-12  [facility] CM/SW Contact  Carley Hammed, [place] Phone Number: 08/16/2021, 12:37 PM  Clinical Narrative:    CSW provided Substance use resources to pt, to be available upon discharge. CM has provided a list of PCP's so pt and family can follow up on Monday. TOC will sign off.        Expected Discharge Plan and Services           Expected Discharge Date: 08/16/21                                     Social Determinants of Health (SDOH) Interventions    Readmission Risk Interventions No flowsheet data found.

## 2023-01-24 IMAGING — CT CT HEAD W/O CM
4 series · 17 of 47 positions shown, 19 images · non-contrast
Comparison: None

CLINICAL DATA: Tremors, alcohol abuse

EXAM:
CT HEAD WITHOUT CONTRAST
TECHNIQUE: Contiguous axial images were obtained from the base of the skull
through the vertex without intravenous contrast.

[Series 3: head wo · axial · 0.40mm/px · z∈[+1031,+1151]mm · 7 of 33 slices shown, 9 images]
[im 5/33  brain]
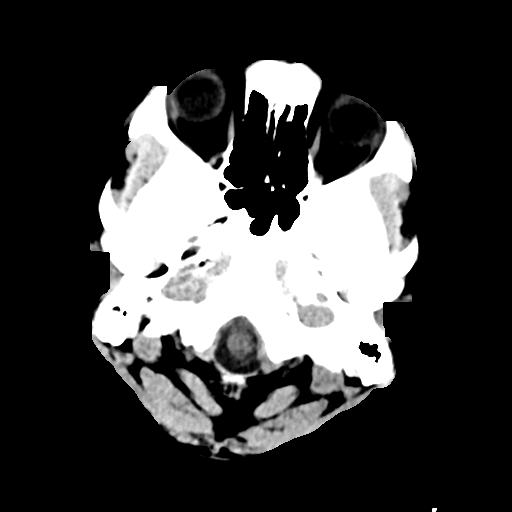
[im 5/33  bone]
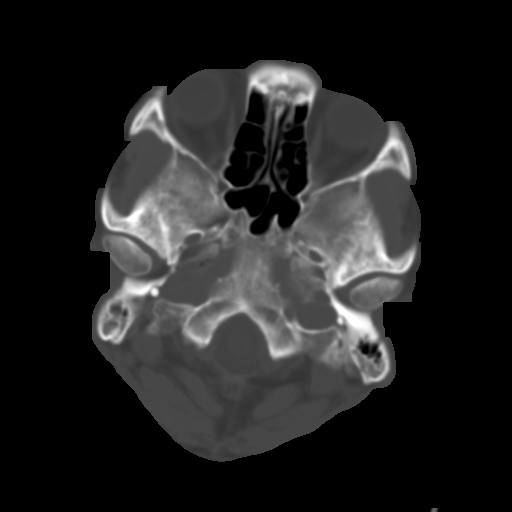
[im 9/33  brain]
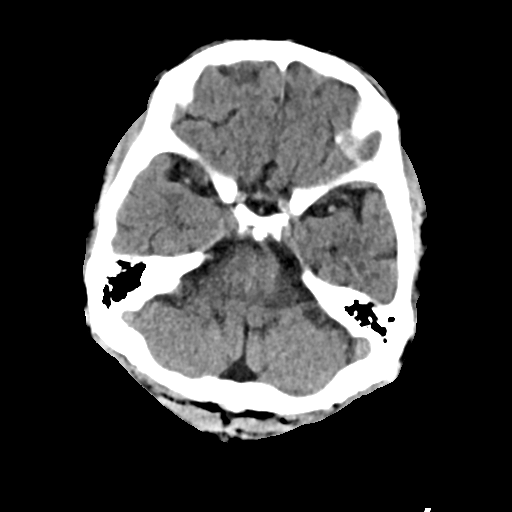
[im 13/33  brain]
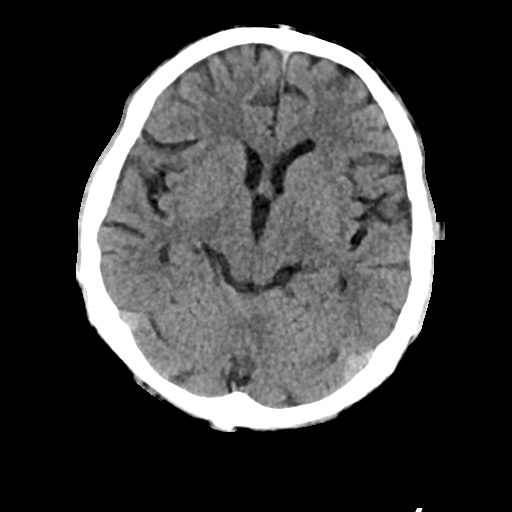
[im 17/33  brain]
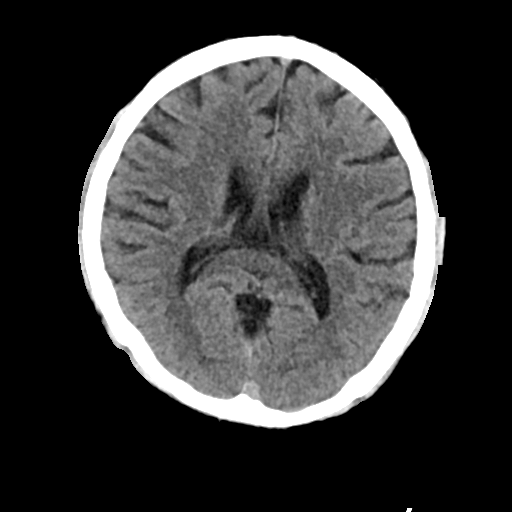
[im 21/33  brain]
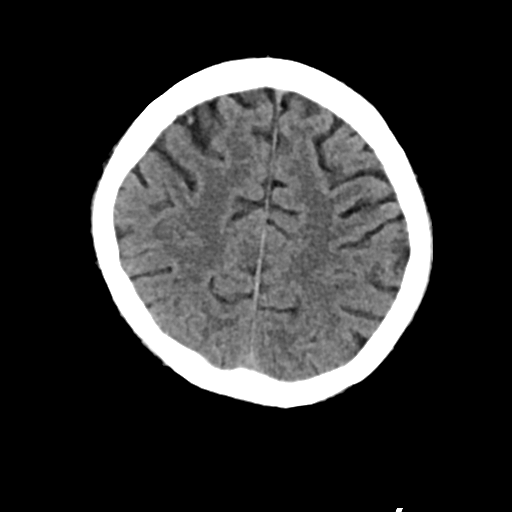
[im 21/33  bone]
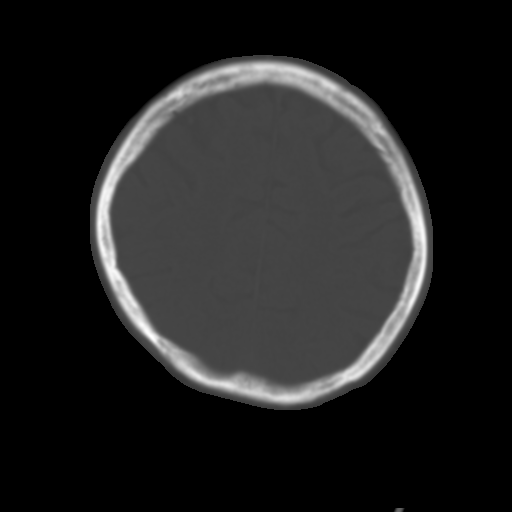
[im 25/33  brain]
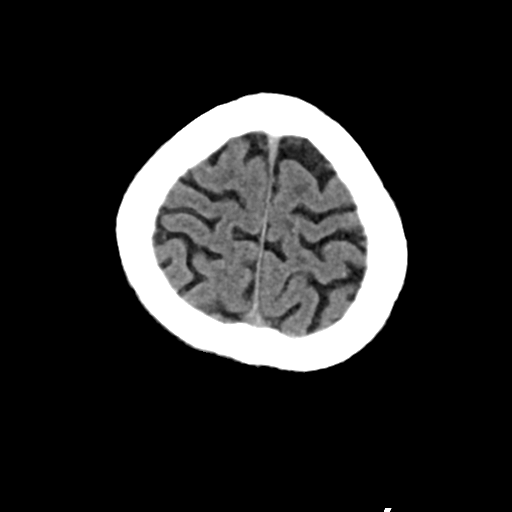
[im 29/33  brain]
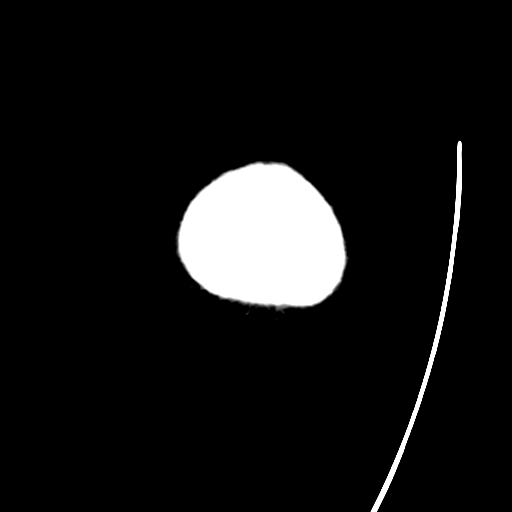

[Series 4: head bone · axial · 0.40mm/px · z∈[+1027,+1083]mm · 4 of 82 slices shown]
[im 9/82  bone]
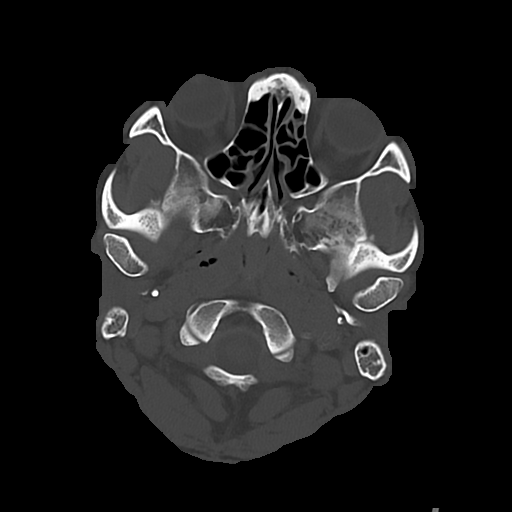
[im 17/82  bone]
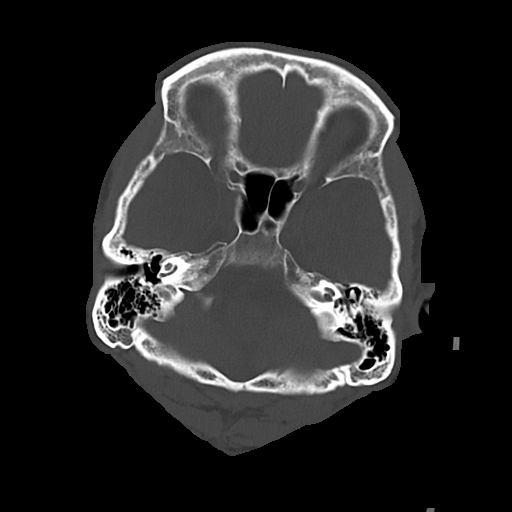
[im 25/82  bone]
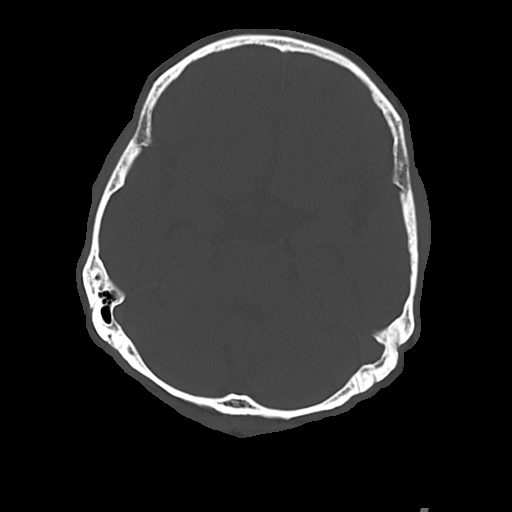
[im 37/82  bone]
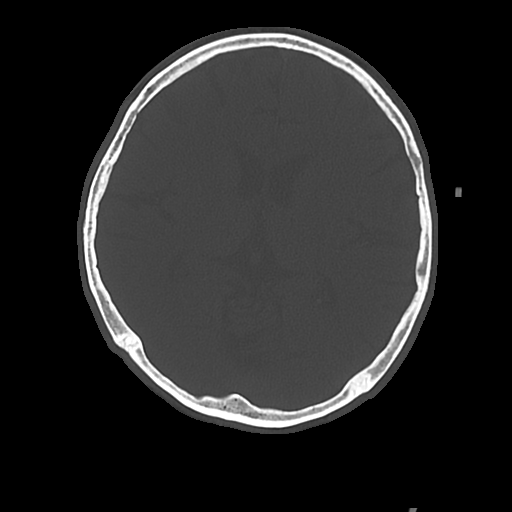

[Series 5: cor soft · coronal · 0.32mm/px · 3 of 61 slices shown]
[im 21/61  brain]
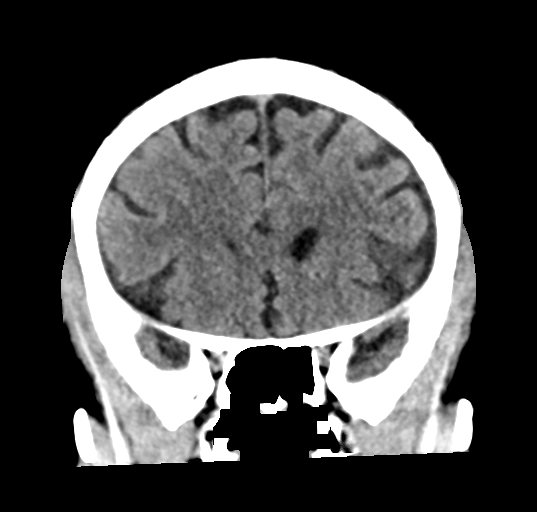
[im 27/61  brain]
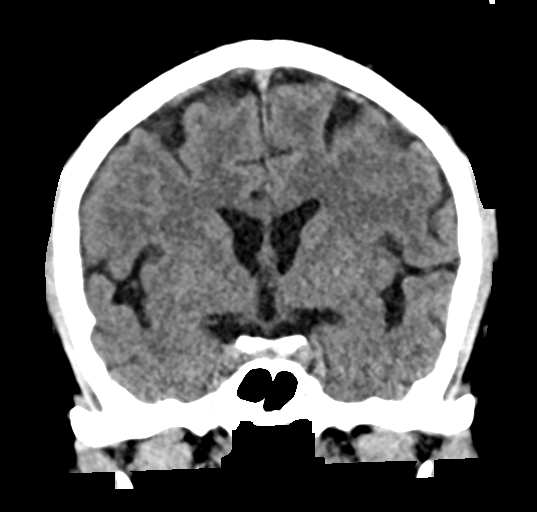
[im 34/61  brain]
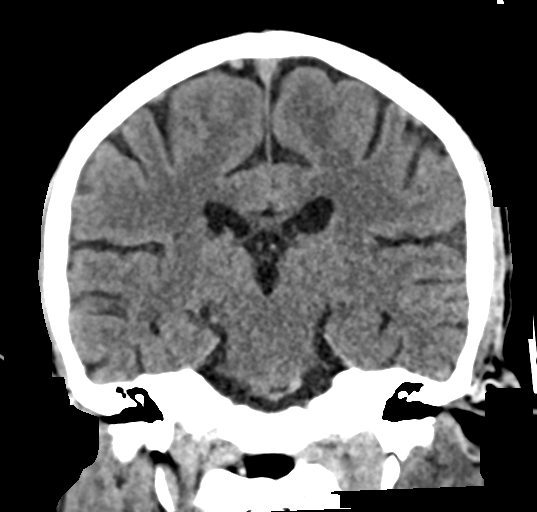

[Series 6: sag soft · sagittal · 0.33mm/px · 3 of 57 slices shown]
[im 19/57  brain]
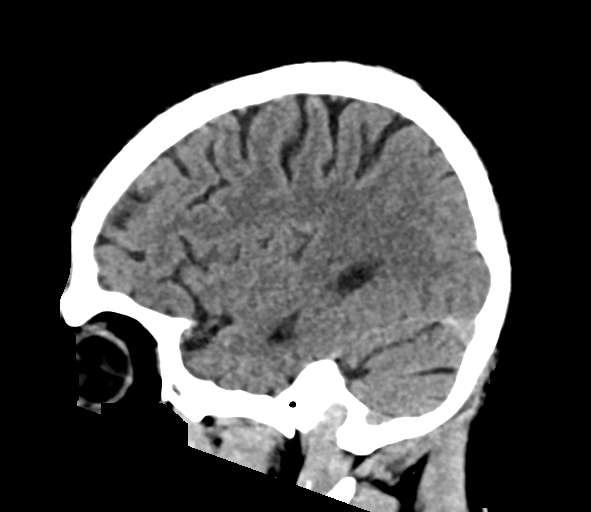
[im 29/57  brain]
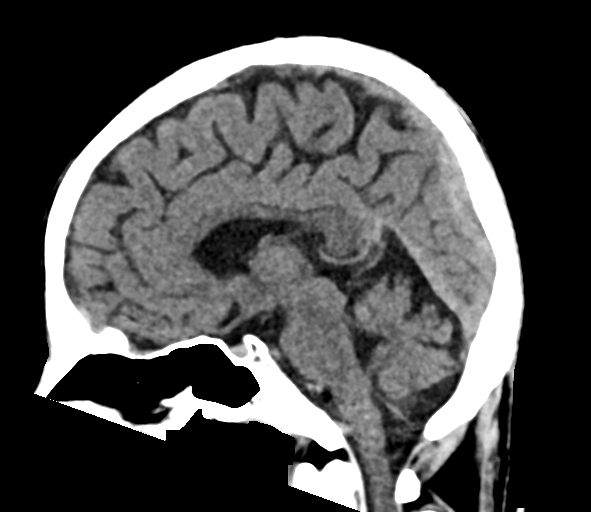
[im 38/57  brain]
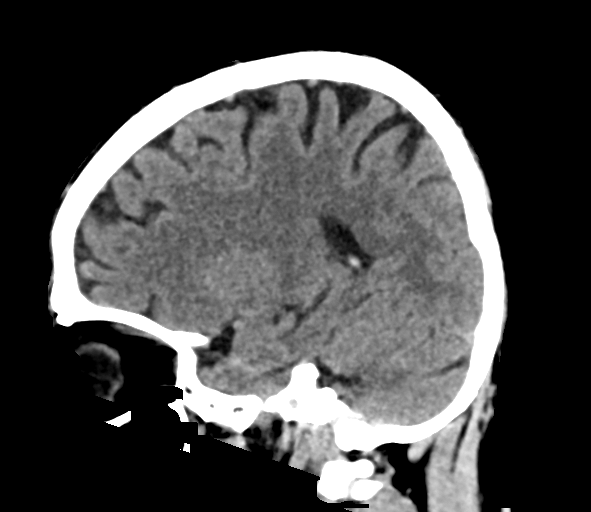

[17 of 47 positions shown; findings below may reference images not displayed]

FINDINGS: Brain: No acute infarct or hemorrhage. Lateral ventricles and
midline structures are unremarkable. No acute extra-axial fluid
collections. No mass effect.

Vascular: No hyperdense vessel or unexpected calcification.

Skull: Normal. Negative for fracture or focal lesion.

Sinuses/Orbits: No acute finding.

Other: None.
IMPRESSION: 1. No acute intracranial process.

## 2023-01-25 IMAGING — US US ABDOMEN LIMITED
1 series · 14 of 25 positions shown · non-contrast
Comparison: None.

CLINICAL DATA: Elevated LFTs

EXAM:
ULTRASOUND ABDOMEN LIMITED RIGHT UPPER QUADRANT

[Series 1: us abdomen limited ruq (liver/gb) · 14 of 34 slices shown]
[im 1/34]
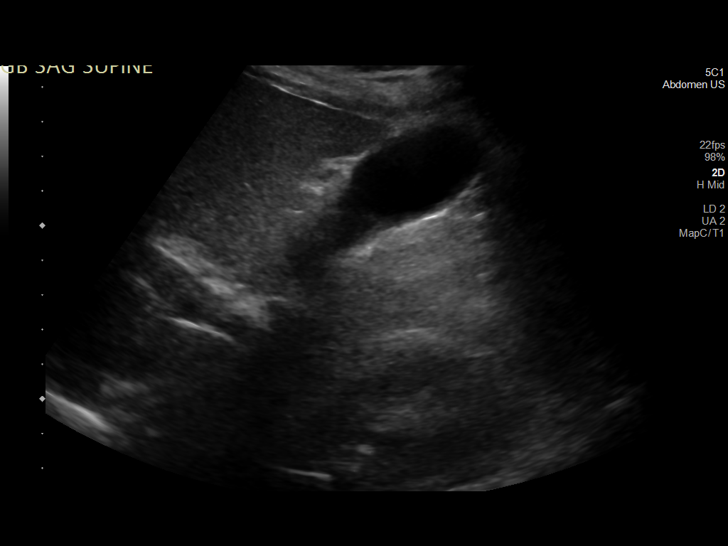
[im 3/34]
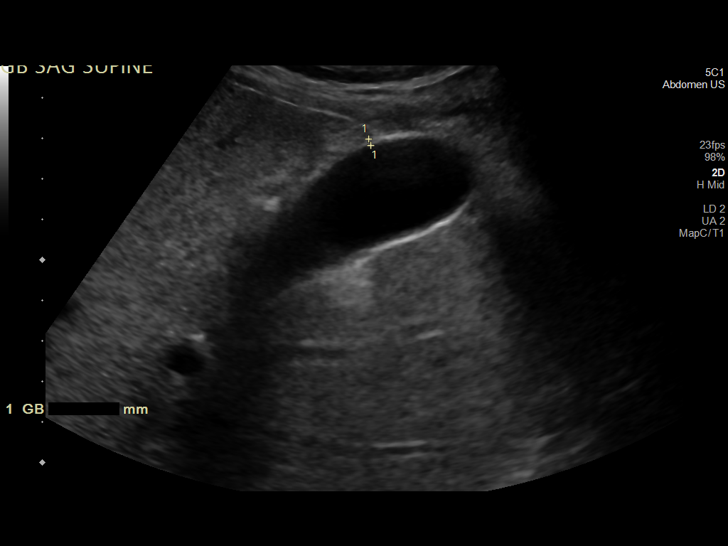
[im 6/34]
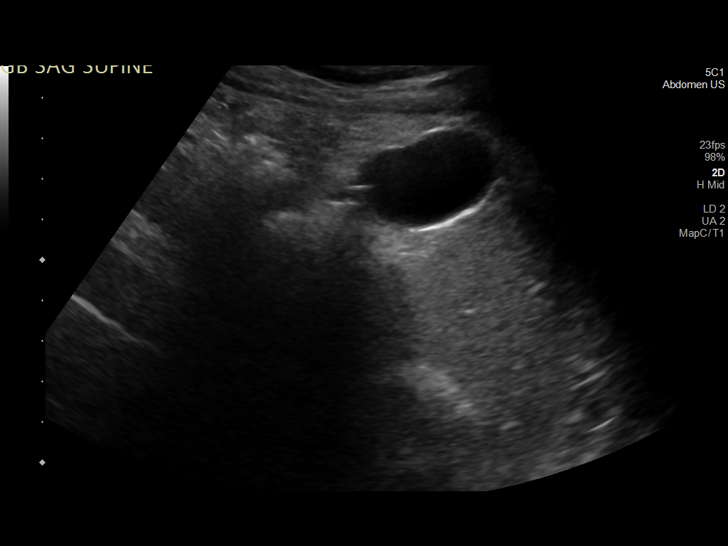
[im 9/34]
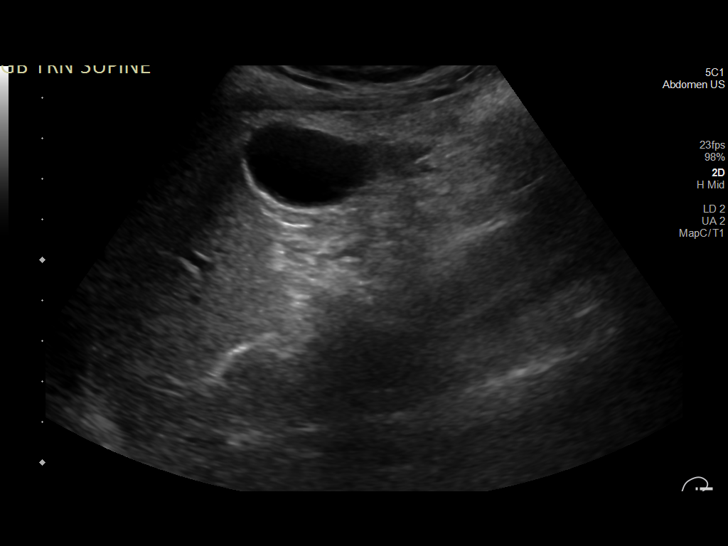
[im 12/34]
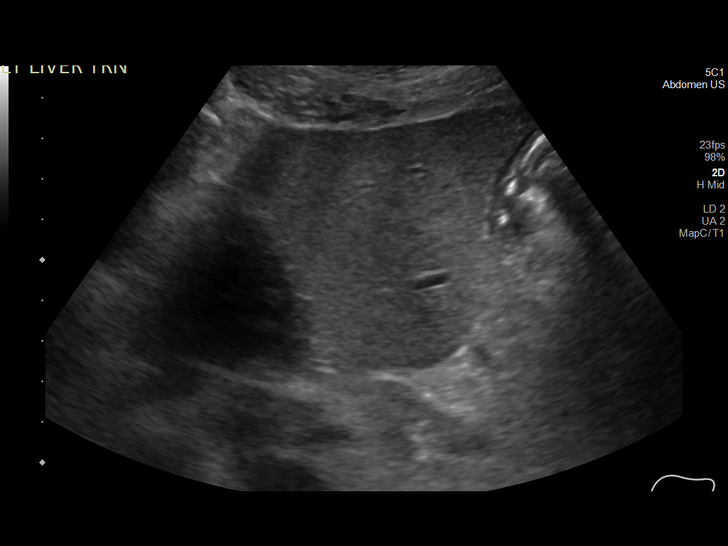
[im 13/34]
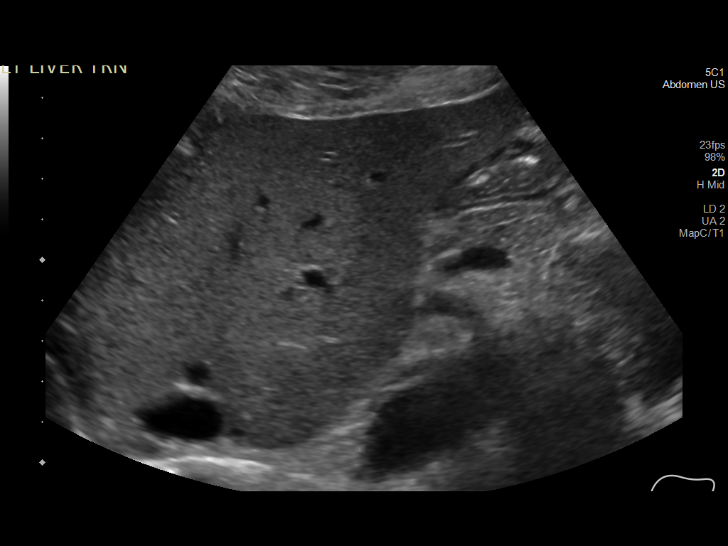
[im 16/34]
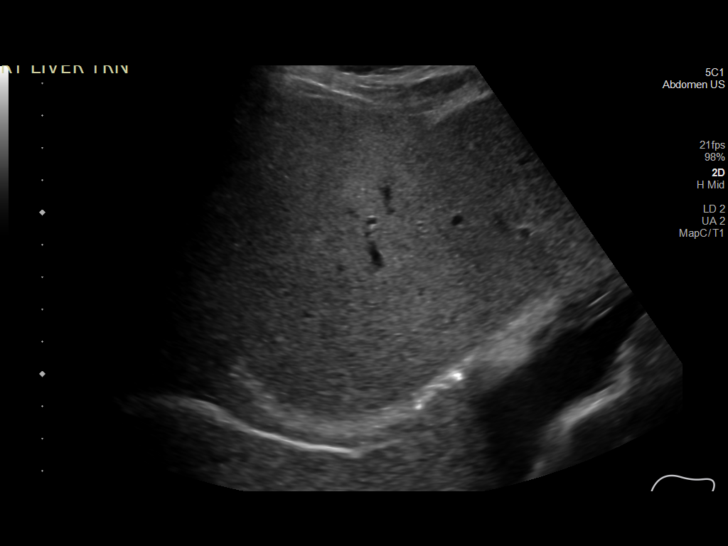
[im 18/34]
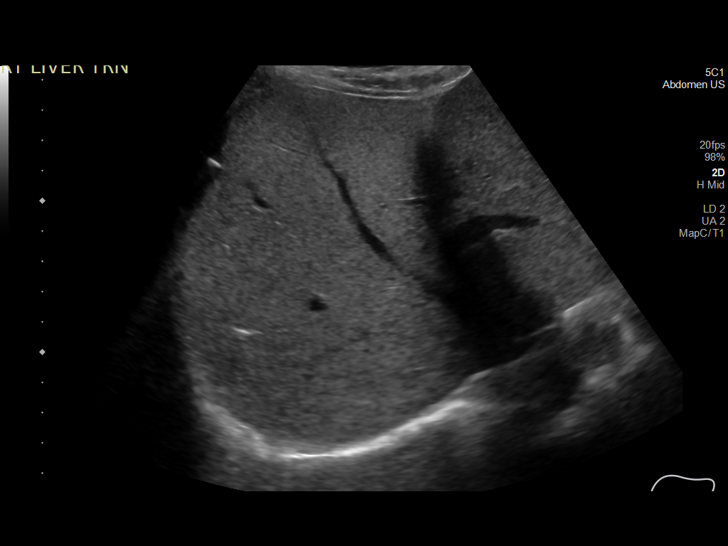
[im 21/34]
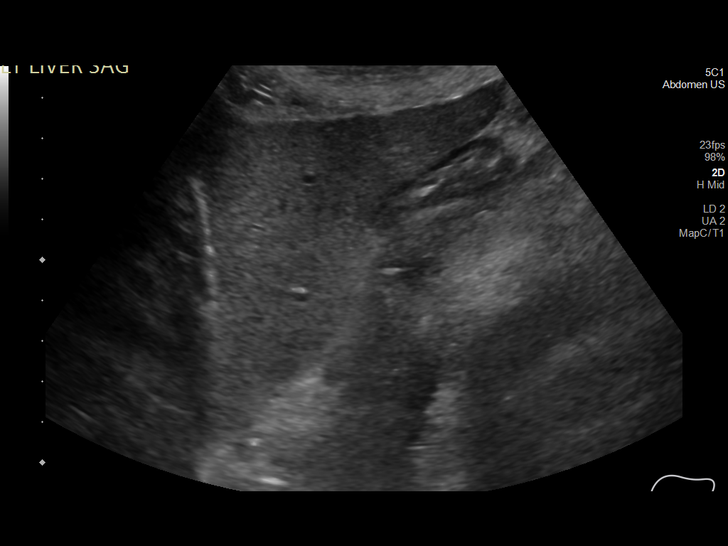
[im 23/34]
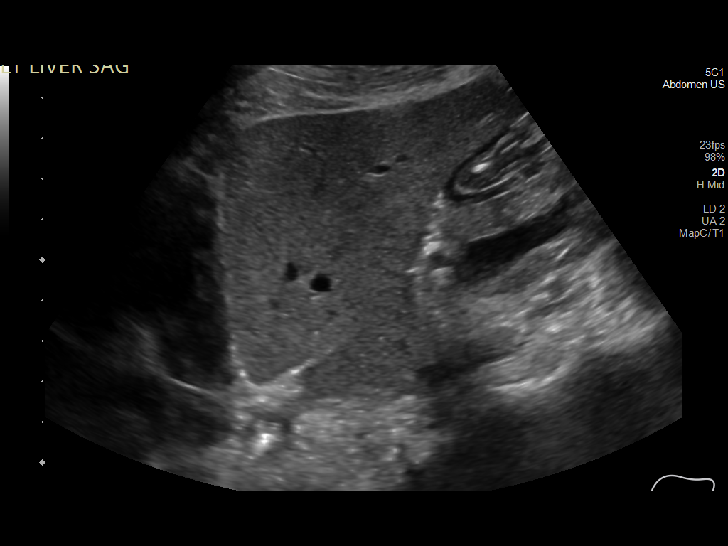
[im 25/34]
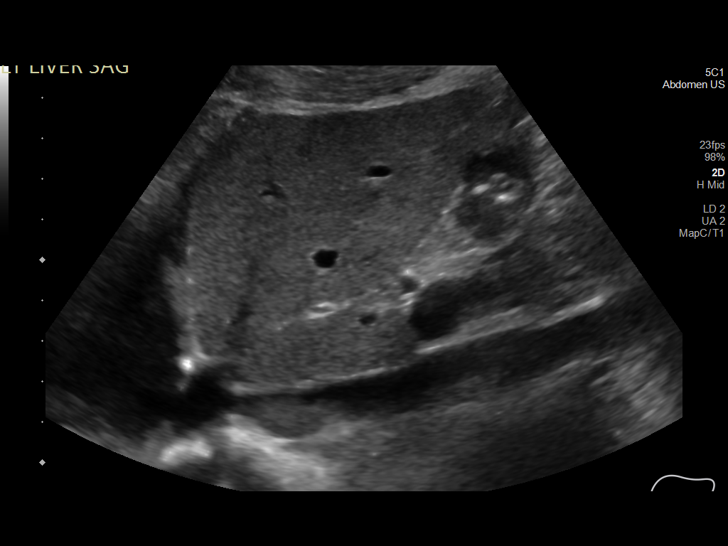
[im 28/34]
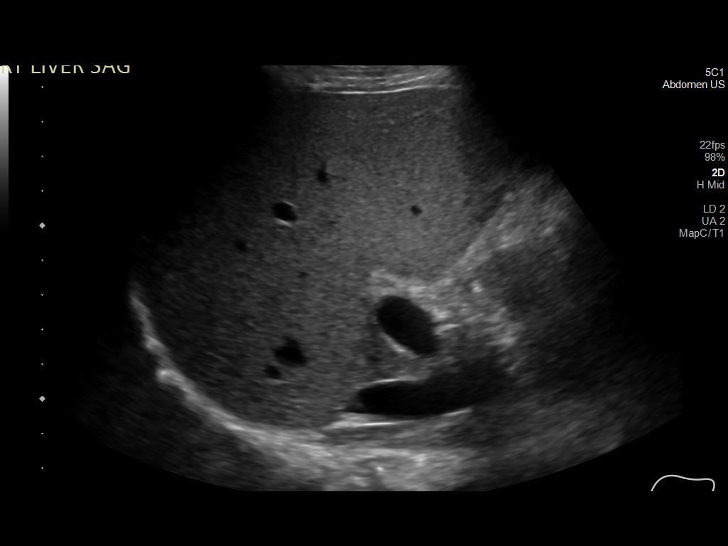
[im 31/34]
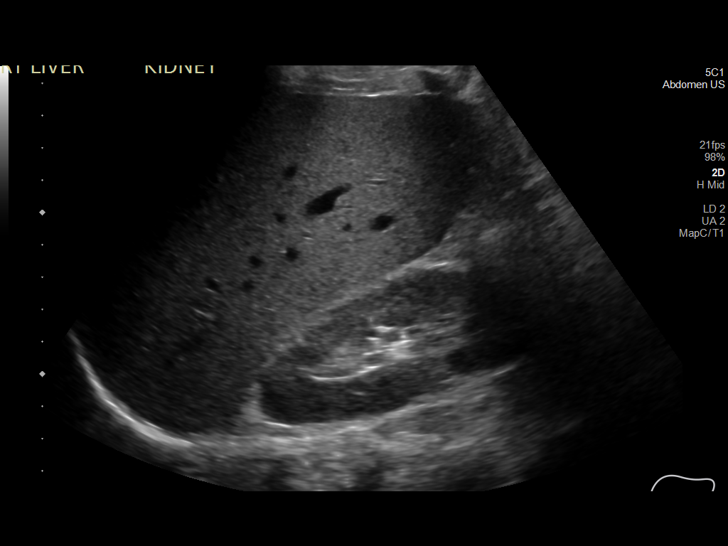
[im 34/34]
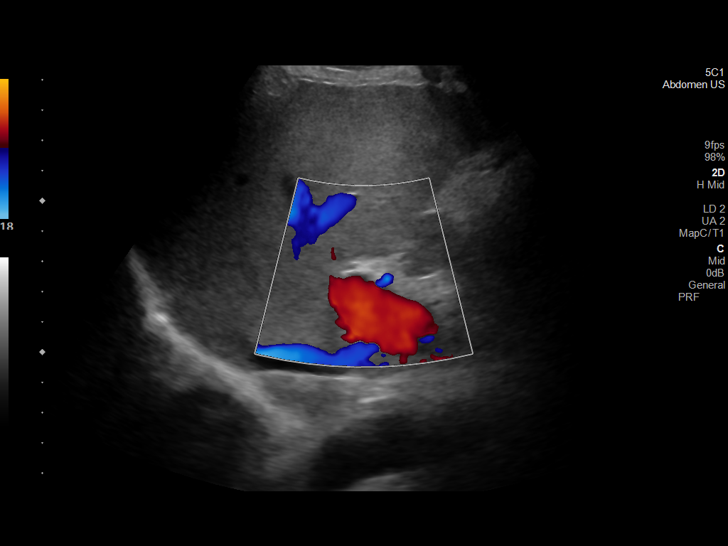

[14 of 25 positions shown; findings below may reference images not displayed]

FINDINGS: Gallbladder:

No gallstones or wall thickening visualized. No sonographic Murphy
sign noted by sonographer.

Common bile duct:

Diameter: 4 mm, normal

Liver:

No focal lesion identified. Within normal limits in parenchymal
echogenicity. Portal vein is patent on color Doppler imaging with
normal direction of blood flow towards the liver.

Other: None.
IMPRESSION: No abnormality identified.
